# Patient Record
Sex: Female | Born: 1960 | Race: Black or African American | Hispanic: No | Marital: Single | Smoking: Never smoker
Health system: Southern US, Community
[De-identification: ages and names within clinical notes are randomized; demographics above are authoritative.]

## PROBLEM LIST (undated history)

## (undated) DIAGNOSIS — E10319 Type 1 diabetes mellitus with unspecified diabetic retinopathy without macular edema: Secondary | ICD-10-CM

## (undated) DIAGNOSIS — K219 Gastro-esophageal reflux disease without esophagitis: Secondary | ICD-10-CM

## (undated) DIAGNOSIS — D649 Anemia, unspecified: Secondary | ICD-10-CM

## (undated) DIAGNOSIS — E1039 Type 1 diabetes mellitus with other diabetic ophthalmic complication: Secondary | ICD-10-CM

## (undated) DIAGNOSIS — R739 Hyperglycemia, unspecified: Secondary | ICD-10-CM

## (undated) DIAGNOSIS — E7800 Pure hypercholesterolemia, unspecified: Secondary | ICD-10-CM

## (undated) DIAGNOSIS — R64 Cachexia: Secondary | ICD-10-CM

## (undated) DIAGNOSIS — N920 Excessive and frequent menstruation with regular cycle: Secondary | ICD-10-CM

## (undated) DIAGNOSIS — J309 Allergic rhinitis, unspecified: Secondary | ICD-10-CM

## (undated) DIAGNOSIS — L409 Psoriasis, unspecified: Secondary | ICD-10-CM

---

## 1998-02-06 ENCOUNTER — Emergency Department (HOSPITAL_COMMUNITY): Admission: EM | Admit: 1998-02-06 | Discharge: 1998-02-06 | Payer: Self-pay | Admitting: Emergency Medicine

## 1999-06-26 ENCOUNTER — Encounter: Admission: RE | Admit: 1999-06-26 | Discharge: 1999-06-26 | Payer: Self-pay | Admitting: Hematology and Oncology

## 1999-06-28 ENCOUNTER — Encounter: Payer: Self-pay | Admitting: Family Medicine

## 1999-06-28 ENCOUNTER — Ambulatory Visit (HOSPITAL_COMMUNITY): Admission: RE | Admit: 1999-06-28 | Discharge: 1999-06-28 | Payer: Self-pay | Admitting: Family Medicine

## 1999-06-29 ENCOUNTER — Encounter: Admission: RE | Admit: 1999-06-29 | Discharge: 1999-06-29 | Payer: Self-pay | Admitting: Internal Medicine

## 1999-07-01 ENCOUNTER — Emergency Department (HOSPITAL_COMMUNITY): Admission: EM | Admit: 1999-07-01 | Discharge: 1999-07-01 | Payer: Self-pay | Admitting: Emergency Medicine

## 1999-09-03 ENCOUNTER — Emergency Department (HOSPITAL_COMMUNITY): Admission: EM | Admit: 1999-09-03 | Discharge: 1999-09-03 | Payer: Self-pay | Admitting: Emergency Medicine

## 1999-10-05 ENCOUNTER — Encounter: Admission: RE | Admit: 1999-10-05 | Discharge: 1999-10-05 | Payer: Self-pay | Admitting: Internal Medicine

## 2000-01-16 ENCOUNTER — Encounter: Payer: Self-pay | Admitting: Internal Medicine

## 2000-01-16 ENCOUNTER — Inpatient Hospital Stay (HOSPITAL_COMMUNITY): Admission: EM | Admit: 2000-01-16 | Discharge: 2000-01-18 | Payer: Self-pay | Admitting: Internal Medicine

## 2000-02-01 ENCOUNTER — Inpatient Hospital Stay: Admission: EM | Admit: 2000-02-01 | Discharge: 2000-02-02 | Payer: Self-pay

## 2000-02-05 ENCOUNTER — Emergency Department (HOSPITAL_COMMUNITY): Admission: EM | Admit: 2000-02-05 | Discharge: 2000-02-05 | Payer: Self-pay | Admitting: Emergency Medicine

## 2000-02-21 ENCOUNTER — Other Ambulatory Visit: Admission: RE | Admit: 2000-02-21 | Discharge: 2000-02-21 | Payer: Self-pay | Admitting: Obstetrics and Gynecology

## 2000-04-22 ENCOUNTER — Inpatient Hospital Stay (HOSPITAL_COMMUNITY): Admission: EM | Admit: 2000-04-22 | Discharge: 2000-04-23 | Payer: Self-pay | Admitting: Emergency Medicine

## 2000-05-15 ENCOUNTER — Emergency Department (HOSPITAL_COMMUNITY): Admission: EM | Admit: 2000-05-15 | Discharge: 2000-05-15 | Payer: Self-pay | Admitting: Emergency Medicine

## 2000-05-19 ENCOUNTER — Encounter: Admission: RE | Admit: 2000-05-19 | Discharge: 2000-05-19 | Payer: Self-pay | Admitting: Internal Medicine

## 2000-06-02 ENCOUNTER — Emergency Department (HOSPITAL_COMMUNITY): Admission: EM | Admit: 2000-06-02 | Discharge: 2000-06-03 | Payer: Self-pay | Admitting: Emergency Medicine

## 2000-06-09 ENCOUNTER — Encounter: Admission: RE | Admit: 2000-06-09 | Discharge: 2000-06-09 | Payer: Self-pay | Admitting: Internal Medicine

## 2000-08-03 ENCOUNTER — Emergency Department (HOSPITAL_COMMUNITY): Admission: EM | Admit: 2000-08-03 | Discharge: 2000-08-03 | Payer: Self-pay | Admitting: Emergency Medicine

## 2000-08-03 ENCOUNTER — Encounter: Payer: Self-pay | Admitting: Emergency Medicine

## 2000-08-26 ENCOUNTER — Encounter: Admission: RE | Admit: 2000-08-26 | Discharge: 2000-08-26 | Payer: Self-pay | Admitting: Hematology and Oncology

## 2000-09-09 ENCOUNTER — Encounter: Admission: RE | Admit: 2000-09-09 | Discharge: 2000-09-09 | Payer: Self-pay | Admitting: Internal Medicine

## 2000-10-20 ENCOUNTER — Emergency Department (HOSPITAL_COMMUNITY): Admission: EM | Admit: 2000-10-20 | Discharge: 2000-10-21 | Payer: Self-pay | Admitting: Emergency Medicine

## 2000-12-05 ENCOUNTER — Inpatient Hospital Stay (HOSPITAL_COMMUNITY): Admission: EM | Admit: 2000-12-05 | Discharge: 2000-12-05 | Payer: Self-pay | Admitting: *Deleted

## 2001-02-17 ENCOUNTER — Encounter: Admission: RE | Admit: 2001-02-17 | Discharge: 2001-02-17 | Payer: Self-pay | Admitting: Internal Medicine

## 2001-05-24 ENCOUNTER — Emergency Department (HOSPITAL_COMMUNITY): Admission: EM | Admit: 2001-05-24 | Discharge: 2001-05-24 | Payer: Self-pay | Admitting: *Deleted

## 2001-06-12 ENCOUNTER — Emergency Department (HOSPITAL_COMMUNITY): Admission: EM | Admit: 2001-06-12 | Discharge: 2001-06-12 | Payer: Self-pay | Admitting: Emergency Medicine

## 2001-06-14 ENCOUNTER — Emergency Department (HOSPITAL_COMMUNITY): Admission: EM | Admit: 2001-06-14 | Discharge: 2001-06-14 | Payer: Self-pay | Admitting: Emergency Medicine

## 2001-06-17 ENCOUNTER — Encounter: Admission: RE | Admit: 2001-06-17 | Discharge: 2001-06-17 | Payer: Self-pay | Admitting: Internal Medicine

## 2001-07-03 ENCOUNTER — Encounter: Admission: RE | Admit: 2001-07-03 | Discharge: 2001-10-01 | Payer: Self-pay | Admitting: Internal Medicine

## 2001-07-14 ENCOUNTER — Encounter: Admission: RE | Admit: 2001-07-14 | Discharge: 2001-07-14 | Payer: Self-pay | Admitting: Internal Medicine

## 2001-10-23 ENCOUNTER — Emergency Department (HOSPITAL_COMMUNITY): Admission: EM | Admit: 2001-10-23 | Discharge: 2001-10-23 | Payer: Self-pay | Admitting: Emergency Medicine

## 2001-12-25 ENCOUNTER — Emergency Department (HOSPITAL_COMMUNITY): Admission: EM | Admit: 2001-12-25 | Discharge: 2001-12-25 | Payer: Self-pay | Admitting: Emergency Medicine

## 2002-01-09 ENCOUNTER — Emergency Department (HOSPITAL_COMMUNITY): Admission: EM | Admit: 2002-01-09 | Discharge: 2002-01-10 | Payer: Self-pay

## 2002-01-13 ENCOUNTER — Emergency Department (HOSPITAL_COMMUNITY): Admission: EM | Admit: 2002-01-13 | Discharge: 2002-01-13 | Payer: Self-pay | Admitting: Emergency Medicine

## 2002-03-21 ENCOUNTER — Emergency Department (HOSPITAL_COMMUNITY): Admission: EM | Admit: 2002-03-21 | Discharge: 2002-03-21 | Payer: Self-pay | Admitting: *Deleted

## 2002-07-19 ENCOUNTER — Emergency Department (HOSPITAL_COMMUNITY): Admission: EM | Admit: 2002-07-19 | Discharge: 2002-07-19 | Payer: Self-pay | Admitting: Emergency Medicine

## 2002-08-25 ENCOUNTER — Encounter: Payer: Self-pay | Admitting: Family Medicine

## 2002-08-25 ENCOUNTER — Encounter: Admission: RE | Admit: 2002-08-25 | Discharge: 2002-08-25 | Payer: Self-pay | Admitting: Family Medicine

## 2003-01-06 ENCOUNTER — Inpatient Hospital Stay (HOSPITAL_COMMUNITY): Admission: EM | Admit: 2003-01-06 | Discharge: 2003-01-08 | Payer: Self-pay | Admitting: Emergency Medicine

## 2003-06-28 ENCOUNTER — Emergency Department (HOSPITAL_COMMUNITY): Admission: EM | Admit: 2003-06-28 | Discharge: 2003-06-29 | Payer: Self-pay | Admitting: Emergency Medicine

## 2004-07-16 ENCOUNTER — Ambulatory Visit: Payer: Self-pay | Admitting: Family Medicine

## 2004-07-16 ENCOUNTER — Ambulatory Visit: Payer: Self-pay | Admitting: *Deleted

## 2004-09-02 ENCOUNTER — Emergency Department (HOSPITAL_COMMUNITY): Admission: EM | Admit: 2004-09-02 | Discharge: 2004-09-02 | Payer: Self-pay

## 2004-10-22 ENCOUNTER — Emergency Department (HOSPITAL_COMMUNITY): Admission: EM | Admit: 2004-10-22 | Discharge: 2004-10-22 | Payer: Self-pay | Admitting: Emergency Medicine

## 2004-12-17 ENCOUNTER — Emergency Department (HOSPITAL_COMMUNITY): Admission: EM | Admit: 2004-12-17 | Discharge: 2004-12-17 | Payer: Self-pay | Admitting: Emergency Medicine

## 2004-12-18 ENCOUNTER — Ambulatory Visit: Payer: Self-pay | Admitting: Family Medicine

## 2004-12-31 ENCOUNTER — Ambulatory Visit: Payer: Self-pay | Admitting: Family Medicine

## 2005-01-03 ENCOUNTER — Ambulatory Visit: Payer: Self-pay | Admitting: Family Medicine

## 2005-01-22 ENCOUNTER — Ambulatory Visit: Payer: Self-pay | Admitting: Family Medicine

## 2005-03-05 ENCOUNTER — Ambulatory Visit: Payer: Self-pay | Admitting: Family Medicine

## 2005-03-15 ENCOUNTER — Emergency Department (HOSPITAL_COMMUNITY): Admission: EM | Admit: 2005-03-15 | Discharge: 2005-03-15 | Payer: Self-pay | Admitting: Emergency Medicine

## 2005-03-21 ENCOUNTER — Ambulatory Visit: Payer: Self-pay | Admitting: Gastroenterology

## 2005-03-21 ENCOUNTER — Encounter (INDEPENDENT_AMBULATORY_CARE_PROVIDER_SITE_OTHER): Payer: Self-pay | Admitting: Specialist

## 2005-03-21 ENCOUNTER — Inpatient Hospital Stay (HOSPITAL_COMMUNITY): Admission: EM | Admit: 2005-03-21 | Discharge: 2005-03-22 | Payer: Self-pay | Admitting: Emergency Medicine

## 2005-05-28 ENCOUNTER — Ambulatory Visit: Payer: Self-pay | Admitting: Family Medicine

## 2005-06-14 ENCOUNTER — Ambulatory Visit: Payer: Self-pay | Admitting: Family Medicine

## 2005-09-02 ENCOUNTER — Emergency Department (HOSPITAL_COMMUNITY): Admission: EM | Admit: 2005-09-02 | Discharge: 2005-09-03 | Payer: Self-pay | Admitting: Emergency Medicine

## 2005-12-19 ENCOUNTER — Ambulatory Visit: Payer: Self-pay | Admitting: Family Medicine

## 2005-12-27 ENCOUNTER — Ambulatory Visit: Payer: Self-pay | Admitting: Family Medicine

## 2005-12-30 ENCOUNTER — Ambulatory Visit: Payer: Self-pay | Admitting: Family Medicine

## 2006-01-21 ENCOUNTER — Ambulatory Visit: Payer: Self-pay | Admitting: Family Medicine

## 2006-04-24 ENCOUNTER — Ambulatory Visit: Payer: Self-pay | Admitting: Family Medicine

## 2006-05-14 ENCOUNTER — Encounter: Admission: RE | Admit: 2006-05-14 | Discharge: 2006-08-12 | Payer: Self-pay | Admitting: Internal Medicine

## 2006-05-20 ENCOUNTER — Ambulatory Visit: Payer: Self-pay | Admitting: Internal Medicine

## 2006-06-04 ENCOUNTER — Inpatient Hospital Stay (HOSPITAL_COMMUNITY): Admission: EM | Admit: 2006-06-04 | Discharge: 2006-06-05 | Payer: Self-pay | Admitting: Emergency Medicine

## 2006-10-10 ENCOUNTER — Encounter: Admission: RE | Admit: 2006-10-10 | Discharge: 2007-01-08 | Payer: Self-pay | Admitting: Internal Medicine

## 2006-12-14 ENCOUNTER — Inpatient Hospital Stay (HOSPITAL_COMMUNITY): Admission: EM | Admit: 2006-12-14 | Discharge: 2006-12-15 | Payer: Self-pay | Admitting: Emergency Medicine

## 2007-01-22 ENCOUNTER — Encounter: Payer: Self-pay | Admitting: Internal Medicine

## 2007-02-13 DIAGNOSIS — L408 Other psoriasis: Secondary | ICD-10-CM

## 2007-02-13 DIAGNOSIS — D539 Nutritional anemia, unspecified: Secondary | ICD-10-CM | POA: Insufficient documentation

## 2007-02-13 DIAGNOSIS — Z91199 Patient's noncompliance with other medical treatment and regimen due to unspecified reason: Secondary | ICD-10-CM | POA: Insufficient documentation

## 2007-02-13 DIAGNOSIS — I10 Essential (primary) hypertension: Secondary | ICD-10-CM | POA: Insufficient documentation

## 2007-02-13 DIAGNOSIS — IMO0002 Reserved for concepts with insufficient information to code with codable children: Secondary | ICD-10-CM | POA: Insufficient documentation

## 2007-02-13 DIAGNOSIS — F17200 Nicotine dependence, unspecified, uncomplicated: Secondary | ICD-10-CM | POA: Insufficient documentation

## 2007-02-13 DIAGNOSIS — F341 Dysthymic disorder: Secondary | ICD-10-CM

## 2007-02-13 DIAGNOSIS — E1065 Type 1 diabetes mellitus with hyperglycemia: Secondary | ICD-10-CM

## 2007-02-13 DIAGNOSIS — F1010 Alcohol abuse, uncomplicated: Secondary | ICD-10-CM

## 2007-02-13 DIAGNOSIS — Z9119 Patient's noncompliance with other medical treatment and regimen: Secondary | ICD-10-CM

## 2007-05-01 ENCOUNTER — Emergency Department (HOSPITAL_COMMUNITY): Admission: EM | Admit: 2007-05-01 | Discharge: 2007-05-01 | Payer: Self-pay | Admitting: Emergency Medicine

## 2007-05-05 ENCOUNTER — Ambulatory Visit: Payer: Self-pay | Admitting: Family Medicine

## 2007-05-06 ENCOUNTER — Encounter (INDEPENDENT_AMBULATORY_CARE_PROVIDER_SITE_OTHER): Payer: Self-pay | Admitting: *Deleted

## 2007-07-06 ENCOUNTER — Inpatient Hospital Stay (HOSPITAL_COMMUNITY): Admission: EM | Admit: 2007-07-06 | Discharge: 2007-07-07 | Payer: Self-pay | Admitting: *Deleted

## 2007-08-04 ENCOUNTER — Ambulatory Visit: Payer: Self-pay | Admitting: Family Medicine

## 2007-08-04 LAB — CONVERTED CEMR LAB
Calcium: 9.3 mg/dL (ref 8.4–10.5)
Glucose, Bld: 116 mg/dL — ABNORMAL HIGH (ref 70–99)
Potassium: 3.7 meq/L (ref 3.5–5.3)
Sodium: 143 meq/L (ref 135–145)

## 2007-12-24 ENCOUNTER — Ambulatory Visit: Payer: Self-pay | Admitting: Internal Medicine

## 2008-03-08 ENCOUNTER — Ambulatory Visit: Payer: Self-pay | Admitting: Internal Medicine

## 2008-07-02 ENCOUNTER — Emergency Department (HOSPITAL_COMMUNITY): Admission: EM | Admit: 2008-07-02 | Discharge: 2008-07-03 | Payer: Self-pay | Admitting: Emergency Medicine

## 2008-08-15 ENCOUNTER — Ambulatory Visit: Payer: Self-pay | Admitting: Family Medicine

## 2008-08-23 ENCOUNTER — Ambulatory Visit (HOSPITAL_COMMUNITY): Admission: RE | Admit: 2008-08-23 | Discharge: 2008-08-23 | Payer: Self-pay | Admitting: Family Medicine

## 2008-10-25 ENCOUNTER — Ambulatory Visit: Payer: Self-pay | Admitting: Family Medicine

## 2008-10-25 LAB — CONVERTED CEMR LAB: Microalb, Ur: 3.35 mg/dL — ABNORMAL HIGH (ref 0.00–1.89)

## 2008-12-05 ENCOUNTER — Ambulatory Visit: Payer: Self-pay | Admitting: Family Medicine

## 2008-12-06 ENCOUNTER — Encounter (HOSPITAL_COMMUNITY): Admission: RE | Admit: 2008-12-06 | Discharge: 2009-03-06 | Payer: Self-pay | Admitting: Family Medicine

## 2008-12-23 ENCOUNTER — Ambulatory Visit: Payer: Self-pay | Admitting: Family Medicine

## 2008-12-27 ENCOUNTER — Ambulatory Visit: Payer: Self-pay | Admitting: Family Medicine

## 2009-01-03 ENCOUNTER — Ambulatory Visit: Payer: Self-pay | Admitting: Internal Medicine

## 2009-02-17 ENCOUNTER — Encounter (INDEPENDENT_AMBULATORY_CARE_PROVIDER_SITE_OTHER): Payer: Self-pay | Admitting: Family Medicine

## 2009-02-17 ENCOUNTER — Ambulatory Visit: Payer: Self-pay | Admitting: Family Medicine

## 2009-02-28 ENCOUNTER — Emergency Department (HOSPITAL_COMMUNITY): Admission: EM | Admit: 2009-02-28 | Discharge: 2009-02-28 | Payer: Self-pay | Admitting: Family Medicine

## 2009-09-04 ENCOUNTER — Emergency Department (HOSPITAL_COMMUNITY): Admission: EM | Admit: 2009-09-04 | Discharge: 2009-09-04 | Payer: Self-pay | Admitting: Emergency Medicine

## 2009-09-05 ENCOUNTER — Ambulatory Visit: Payer: Self-pay | Admitting: Gastroenterology

## 2009-09-05 ENCOUNTER — Inpatient Hospital Stay (HOSPITAL_COMMUNITY): Admission: EM | Admit: 2009-09-05 | Discharge: 2009-09-08 | Payer: Self-pay | Admitting: Emergency Medicine

## 2009-09-29 ENCOUNTER — Inpatient Hospital Stay (HOSPITAL_COMMUNITY): Admission: EM | Admit: 2009-09-29 | Discharge: 2009-09-30 | Payer: Self-pay | Admitting: Emergency Medicine

## 2010-03-31 ENCOUNTER — Emergency Department (HOSPITAL_COMMUNITY): Admission: EM | Admit: 2010-03-31 | Discharge: 2010-03-31 | Payer: Self-pay | Admitting: Emergency Medicine

## 2010-06-27 ENCOUNTER — Inpatient Hospital Stay (HOSPITAL_COMMUNITY): Admission: EM | Admit: 2010-06-27 | Discharge: 2010-06-29 | Payer: Self-pay | Admitting: Emergency Medicine

## 2010-09-09 ENCOUNTER — Encounter: Payer: Self-pay | Admitting: Urology

## 2010-09-18 NOTE — Procedures (Signed)
Summary: Colonoscopy / [facility]  Colonoscopy / [facility]   Imported By: Lennie Odor 01/31/2010 15:31:36  _____________________________________________________________________  External Attachment:    Type:   Image     Comment:   External Document

## 2010-09-18 NOTE — Procedures (Signed)
Summary: Upper Endoscopy  Patient: Nancy Singh Note: All result statuses are Final unless otherwise noted.  Tests: (1) Upper Endoscopy (EGD)   EGD Upper Endoscopy       DONE     [facility]     [address]     [place], [place]  [address]           ENDOSCOPY PROCEDURE REPORT           PATIENT:  [name], [name]  MR#:  [id number]     BIRTHDATE:  02-02-1961, 48 yrs. old  GENDER:  female           ENDOSCOPIST:  Barbette Hair. Arlyce Dice, MD     Referred by:           PROCEDURE DATE:  09/05/2009     PROCEDURE:  EGD, diagnostic     ASA CLASS:  Class II     INDICATIONS:  hematemesis           MEDICATIONS:   Fentanyl 100 mcg IV, Versed 6 mg IV, glycopyrrolate     (Robinal) 0.2 mg IV     TOPICAL ANESTHETIC:  Cetacaine Spray           DESCRIPTION OF PROCEDURE:   After the risks benefits and     alternatives of the procedure were thoroughly explained, informed     consent was obtained.  The EG-[id number] ([id number]) endoscope was     introduced through the mouth and advanced to the third portion of     the duodenum, without limitations.  The instrument was slowly     withdrawn as the mucosa was fully examined.     <<PROCEDUREIMAGES>>           Esophagitis was found in the mid esophagus. Severe, grade D     erosive esophagitis lower 2/3 (see image003 and image004).  A     stricture was found at the gastroesophageal junction (see     image002). Early esophageal stricture  Otherwise the examination     was normal.    Retroflexed views revealed no abnormalities.    The     scope was then withdrawn from the patient and the procedure     completed.           COMPLICATIONS:  None           ENDOSCOPIC IMPRESSION:     1) Esophagitis in the mid esophagus     2) Stricture at the gastroesophageal junction     3) Otherwise normal examination           Hematemasis secondary to esophagitis           RECOMMENDATIONS:     1) continue PPI           REPEAT EXAM:  No        ______________________________     Barbette Hair. Arlyce Dice, MD           CC:           n.     eSIGNED:   Barbette Hair. Abigayl Hor at 09/05/2009 02:42 PM           Rosalie Gums, [id number]  Note: An exclamation mark (!) indicates a result that was not dispersed into the flowsheet. Document Creation Date: 09/05/2009 2:43 PM _______________________________________________________________________  (1) Order result status: Final Collection or observation date-time: 09/05/2009 14:35 Requested date-time:  Receipt date-time:  Reported date-time:  Referring Physician:   Ordering  Physician: Melvia Heaps [phone]) Specimen Source:  Source: Launa Grill Order Number: [id number] Lab site:

## 2010-10-30 LAB — HEMOGLOBINOPATHY EVALUATION
Hemoglobin Other: 0 % (ref 0.0–0.0)
Hgb A2 Quant: 1.4 % — ABNORMAL LOW (ref 2.2–3.2)
Hgb F Quant: 0 % (ref 0.0–2.0)
Hgb S Quant: 0 % (ref 0.0–0.0)

## 2010-10-30 LAB — CROSSMATCH
DAT, IgG: NEGATIVE
Unit division: 0

## 2010-10-30 LAB — BLOOD GAS, ARTERIAL
Acid-base deficit: 1 mmol/L (ref 0.0–2.0)
Bicarbonate: 23.3 mEq/L (ref 20.0–24.0)
TCO2: 22.7 mmol/L (ref 0–100)
pCO2 arterial: 39.2 mmHg (ref 35.0–45.0)
pH, Arterial: 7.391 (ref 7.350–7.400)
pO2, Arterial: 83.6 mmHg (ref 80.0–100.0)

## 2010-10-30 LAB — RAPID URINE DRUG SCREEN, HOSP PERFORMED
Amphetamines: NOT DETECTED
Cocaine: NOT DETECTED
Opiates: NOT DETECTED
Tetrahydrocannabinol: NOT DETECTED

## 2010-10-30 LAB — URINALYSIS, ROUTINE W REFLEX MICROSCOPIC
Hgb urine dipstick: NEGATIVE
Nitrite: NEGATIVE
Protein, ur: NEGATIVE mg/dL
Urobilinogen, UA: 0.2 mg/dL (ref 0.0–1.0)

## 2010-10-30 LAB — BASIC METABOLIC PANEL
BUN: 5 mg/dL — ABNORMAL LOW (ref 6–23)
CO2: 21 mEq/L (ref 19–32)
CO2: 22 mEq/L (ref 19–32)
Calcium: 6.5 mg/dL — ABNORMAL LOW (ref 8.4–10.5)
Calcium: 6.7 mg/dL — ABNORMAL LOW (ref 8.4–10.5)
Creatinine, Ser: 0.95 mg/dL (ref 0.4–1.2)
GFR calc Af Amer: 59 mL/min — ABNORMAL LOW (ref >60)
GFR calc non Af Amer: 49 mL/min — ABNORMAL LOW (ref >60)
Glucose, Bld: 179 mg/dL — ABNORMAL HIGH (ref 70–99)
Potassium: 3.3 mEq/L — ABNORMAL LOW (ref 3.5–5.1)
Sodium: 137 mEq/L (ref 135–145)

## 2010-10-30 LAB — COMPREHENSIVE METABOLIC PANEL
ALT: 15 U/L (ref 0–35)
AST: 21 U/L (ref 0–37)
AST: 21 U/L (ref 0–37)
Alkaline Phosphatase: 80 U/L (ref 39–117)
BUN: 35 mg/dL — ABNORMAL HIGH (ref 6–23)
CO2: 19 mEq/L (ref 19–32)
CO2: 23 mEq/L (ref 19–32)
Calcium: 7.7 mg/dL — ABNORMAL LOW (ref 8.4–10.5)
Calcium: 7.9 mg/dL — ABNORMAL LOW (ref 8.4–10.5)
Chloride: 101 mEq/L (ref 96–112)
Chloride: 105 mEq/L (ref 96–112)
Creatinine, Ser: 2.02 mg/dL — ABNORMAL HIGH (ref 0.4–1.2)
GFR calc Af Amer: 24 mL/min — ABNORMAL LOW (ref >60)
GFR calc Af Amer: 32 mL/min — ABNORMAL LOW (ref >60)
GFR calc non Af Amer: 20 mL/min — ABNORMAL LOW (ref >60)
GFR calc non Af Amer: 26 mL/min — ABNORMAL LOW (ref >60)
Potassium: 3.6 mEq/L (ref 3.5–5.1)
Sodium: 139 mEq/L (ref 135–145)
Total Bilirubin: 0.9 mg/dL (ref 0.3–1.2)
Total Bilirubin: 1 mg/dL (ref 0.3–1.2)

## 2010-10-30 LAB — CBC
HCT: 29.6 % — ABNORMAL LOW (ref 36.0–46.0)
HCT: 30 % — ABNORMAL LOW (ref 36.0–46.0)
Hemoglobin: 8.1 g/dL — ABNORMAL LOW (ref 12.0–15.0)
Hemoglobin: 9.3 g/dL — ABNORMAL LOW (ref 12.0–15.0)
MCH: 19.7 pg — ABNORMAL LOW (ref 26.0–34.0)
MCH: 19.8 pg — ABNORMAL LOW (ref 26.0–34.0)
MCHC: 30 g/dL (ref 30.0–36.0)
MCHC: 30.9 g/dL (ref 30.0–36.0)
Platelets: 386 10*3/uL (ref 150–400)
Platelets: 593 10*3/uL — ABNORMAL HIGH (ref 150–400)
RBC: 4.68 MIL/uL (ref 3.87–5.11)
RBC: 4.71 MIL/uL (ref 3.87–5.11)
RBC: 4.83 MIL/uL (ref 3.87–5.11)
RDW: 22.8 % — ABNORMAL HIGH (ref 11.5–15.5)
RDW: 27.5 % — ABNORMAL HIGH (ref 11.5–15.5)
RDW: 28.3 % — ABNORMAL HIGH (ref 11.5–15.5)
WBC: 18.8 10*3/uL — ABNORMAL HIGH (ref 4.0–10.5)
WBC: 22.1 10*3/uL — ABNORMAL HIGH (ref 4.0–10.5)

## 2010-10-30 LAB — DIFFERENTIAL
Basophils Absolute: 0 10*3/uL (ref 0.0–0.1)
Basophils Relative: 0 % (ref 0–1)
Eosinophils Absolute: 0 10*3/uL (ref 0.0–0.7)
Eosinophils Relative: 0 % (ref 0–5)
Lymphocytes Relative: 1 % — ABNORMAL LOW (ref 12–46)
Lymphocytes Relative: 4 % — ABNORMAL LOW (ref 12–46)
Monocytes Absolute: 0.2 10*3/uL (ref 0.1–1.0)
Monocytes Absolute: 1.1 10*3/uL — ABNORMAL HIGH (ref 0.1–1.0)
Monocytes Relative: 1 % — ABNORMAL LOW (ref 3–12)
Neutrophils Relative %: 91 % — ABNORMAL HIGH (ref 43–77)
Neutrophils Relative %: 98 % — ABNORMAL HIGH (ref 43–77)

## 2010-10-30 LAB — GLUCOSE, CAPILLARY
Glucose-Capillary: 100 mg/dL — ABNORMAL HIGH (ref 70–99)
Glucose-Capillary: 115 mg/dL — ABNORMAL HIGH (ref 70–99)
Glucose-Capillary: 123 mg/dL — ABNORMAL HIGH (ref 70–99)
Glucose-Capillary: 144 mg/dL — ABNORMAL HIGH (ref 70–99)
Glucose-Capillary: 149 mg/dL — ABNORMAL HIGH (ref 70–99)
Glucose-Capillary: 200 mg/dL — ABNORMAL HIGH (ref 70–99)
Glucose-Capillary: 203 mg/dL — ABNORMAL HIGH (ref 70–99)
Glucose-Capillary: 228 mg/dL — ABNORMAL HIGH (ref 70–99)
Glucose-Capillary: 291 mg/dL — ABNORMAL HIGH (ref 70–99)
Glucose-Capillary: 319 mg/dL — ABNORMAL HIGH (ref 70–99)
Glucose-Capillary: 471 mg/dL — ABNORMAL HIGH (ref 70–99)
Glucose-Capillary: 567 mg/dL (ref 70–99)

## 2010-10-30 LAB — PROTIME-INR
INR: 1.18 (ref 0.00–1.49)
Prothrombin Time: 15.2 seconds (ref 11.6–15.2)

## 2010-10-30 LAB — MRSA PCR SCREENING: MRSA by PCR: NEGATIVE

## 2010-10-30 LAB — URINE MICROSCOPIC-ADD ON

## 2010-10-30 LAB — LIPASE, BLOOD: Lipase: 27 U/L (ref 11–59)

## 2010-10-30 LAB — PREGNANCY, URINE: Preg Test, Ur: NEGATIVE

## 2010-10-30 LAB — BILIRUBIN, DIRECT: Bilirubin, Direct: 0.1 mg/dL (ref 0.0–0.3)

## 2010-10-30 LAB — HEMOCCULT GUIAC POC 1CARD (OFFICE): Fecal Occult Bld: NEGATIVE

## 2010-11-04 LAB — COMPREHENSIVE METABOLIC PANEL
AST: 26 U/L (ref 0–37)
CO2: 23 mEq/L (ref 19–32)
Chloride: 93 mEq/L — ABNORMAL LOW (ref 96–112)
Creatinine, Ser: 1.19 mg/dL (ref 0.4–1.2)
GFR calc Af Amer: 59 mL/min — ABNORMAL LOW (ref >60)
GFR calc non Af Amer: 49 mL/min — ABNORMAL LOW (ref >60)
Total Bilirubin: 1.4 mg/dL — ABNORMAL HIGH (ref 0.3–1.2)

## 2010-11-04 LAB — URINALYSIS, ROUTINE W REFLEX MICROSCOPIC
Bilirubin Urine: NEGATIVE
Glucose, UA: >1000 mg/dL — AB
Hgb urine dipstick: NEGATIVE
Ketones, ur: 40 mg/dL — AB
Protein, ur: NEGATIVE mg/dL

## 2010-11-04 LAB — CBC
HCT: 28.6 % — ABNORMAL LOW (ref 36.0–46.0)
Hemoglobin: 9 g/dL — ABNORMAL LOW (ref 12.0–15.0)
MCV: 64.8 fL — ABNORMAL LOW (ref 78.0–100.0)
RBC: 4.42 MIL/uL (ref 3.87–5.11)
WBC: 13.7 10*3/uL — ABNORMAL HIGH (ref 4.0–10.5)

## 2010-11-04 LAB — DIFFERENTIAL
Basophils Absolute: 0 10*3/uL (ref 0.0–0.1)
Eosinophils Absolute: 0 10*3/uL (ref 0.0–0.7)
Lymphocytes Relative: 6 % — ABNORMAL LOW (ref 12–46)
Lymphs Abs: 0.8 10*3/uL (ref 0.7–4.0)
Neutro Abs: 12.2 10*3/uL — ABNORMAL HIGH (ref 1.7–7.7)

## 2010-11-04 LAB — GLUCOSE, CAPILLARY
Glucose-Capillary: 182 mg/dL — ABNORMAL HIGH (ref 70–99)
Glucose-Capillary: 361 mg/dL — ABNORMAL HIGH (ref 70–99)

## 2010-11-05 LAB — CARDIAC PANEL(CRET KIN+CKTOT+MB+TROPI)
CK, MB: 1.9 ng/mL (ref 0.3–4.0)
Relative Index: INVALID (ref 0.0–2.5)
Total CK: 98 U/L (ref 7–177)
Troponin I: 0.03 ng/mL (ref 0.00–0.06)

## 2010-11-05 LAB — CBC
HCT: 23.2 % — ABNORMAL LOW (ref 36.0–46.0)
HCT: 23.5 % — ABNORMAL LOW (ref 36.0–46.0)
HCT: 25.5 % — ABNORMAL LOW (ref 36.0–46.0)
HCT: 25.8 % — ABNORMAL LOW (ref 36.0–46.0)
HCT: 26.5 % — ABNORMAL LOW (ref 36.0–46.0)
HCT: 27.1 % — ABNORMAL LOW (ref 36.0–46.0)
Hemoglobin: 7 g/dL — ABNORMAL LOW (ref 12.0–15.0)
Hemoglobin: 7.4 g/dL — ABNORMAL LOW (ref 12.0–15.0)
Hemoglobin: 7.6 g/dL — ABNORMAL LOW (ref 12.0–15.0)
Hemoglobin: 7.9 g/dL — ABNORMAL LOW (ref 12.0–15.0)
Hemoglobin: 8 g/dL — ABNORMAL LOW (ref 12.0–15.0)
Hemoglobin: 9 g/dL — ABNORMAL LOW (ref 12.0–15.0)
MCHC: 30.8 g/dL (ref 30.0–36.0)
MCHC: 31.2 g/dL (ref 30.0–36.0)
MCHC: 31.4 g/dL (ref 30.0–36.0)
MCHC: 31.5 g/dL (ref 30.0–36.0)
MCHC: 31.7 g/dL (ref 30.0–36.0)
MCHC: 32 g/dL (ref 30.0–36.0)
MCHC: 32 g/dL (ref 30.0–36.0)
MCV: 65.1 fL — ABNORMAL LOW (ref 78.0–100.0)
MCV: 65.1 fL — ABNORMAL LOW (ref 78.0–100.0)
MCV: 65.3 fL — ABNORMAL LOW (ref 78.0–100.0)
MCV: 65.8 fL — ABNORMAL LOW (ref 78.0–100.0)
MCV: 65.9 fL — ABNORMAL LOW (ref 78.0–100.0)
Platelets: 312 10*3/uL (ref 150–400)
Platelets: 384 10*3/uL (ref 150–400)
Platelets: 408 10*3/uL — ABNORMAL HIGH (ref 150–400)
Platelets: 414 10*3/uL — ABNORMAL HIGH (ref 150–400)
RBC: 3.36 MIL/uL — ABNORMAL LOW (ref 3.87–5.11)
RBC: 3.61 MIL/uL — ABNORMAL LOW (ref 3.87–5.11)
RBC: 3.7 MIL/uL — ABNORMAL LOW (ref 3.87–5.11)
RBC: 3.91 MIL/uL (ref 3.87–5.11)
RBC: 4.21 MIL/uL (ref 3.87–5.11)
RDW: 19.3 % — ABNORMAL HIGH (ref 11.5–15.5)
RDW: 19.4 % — ABNORMAL HIGH (ref 11.5–15.5)
RDW: 19.6 % — ABNORMAL HIGH (ref 11.5–15.5)
RDW: 19.8 % — ABNORMAL HIGH (ref 11.5–15.5)
WBC: 15.3 10*3/uL — ABNORMAL HIGH (ref 4.0–10.5)
WBC: 15.3 10*3/uL — ABNORMAL HIGH (ref 4.0–10.5)
WBC: 6.8 10*3/uL (ref 4.0–10.5)
WBC: 7.3 10*3/uL (ref 4.0–10.5)
WBC: 8.3 10*3/uL (ref 4.0–10.5)
WBC: 8.6 10*3/uL (ref 4.0–10.5)

## 2010-11-05 LAB — GLUCOSE, CAPILLARY
Glucose-Capillary: 118 mg/dL — ABNORMAL HIGH (ref 70–99)
Glucose-Capillary: 122 mg/dL — ABNORMAL HIGH (ref 70–99)
Glucose-Capillary: 137 mg/dL — ABNORMAL HIGH (ref 70–99)
Glucose-Capillary: 141 mg/dL — ABNORMAL HIGH (ref 70–99)
Glucose-Capillary: 147 mg/dL — ABNORMAL HIGH (ref 70–99)
Glucose-Capillary: 176 mg/dL — ABNORMAL HIGH (ref 70–99)
Glucose-Capillary: 185 mg/dL — ABNORMAL HIGH (ref 70–99)
Glucose-Capillary: 216 mg/dL — ABNORMAL HIGH (ref 70–99)
Glucose-Capillary: 275 mg/dL — ABNORMAL HIGH (ref 70–99)
Glucose-Capillary: 296 mg/dL — ABNORMAL HIGH (ref 70–99)
Glucose-Capillary: 91 mg/dL (ref 70–99)

## 2010-11-05 LAB — IRON AND TIBC
Iron: 14 ug/dL — ABNORMAL LOW (ref 42–135)
Saturation Ratios: 4 % — ABNORMAL LOW (ref 20–55)
UIBC: 304 ug/dL
UIBC: 325 ug/dL

## 2010-11-05 LAB — COMPREHENSIVE METABOLIC PANEL
ALT: 10 U/L (ref 0–35)
AST: 20 U/L (ref 0–37)
Calcium: 8 mg/dL — ABNORMAL LOW (ref 8.4–10.5)
Creatinine, Ser: 1.13 mg/dL (ref 0.4–1.2)
GFR calc Af Amer: >60 mL/min (ref >60)
Sodium: 132 mEq/L — ABNORMAL LOW (ref 135–145)
Total Protein: 5.8 g/dL — ABNORMAL LOW (ref 6.0–8.3)

## 2010-11-05 LAB — BASIC METABOLIC PANEL
BUN: 10 mg/dL (ref 6–23)
CO2: 20 mEq/L (ref 19–32)
CO2: 23 mEq/L (ref 19–32)
Calcium: 7.9 mg/dL — ABNORMAL LOW (ref 8.4–10.5)
Chloride: 100 mEq/L (ref 96–112)
Creatinine, Ser: 0.96 mg/dL (ref 0.4–1.2)
Creatinine, Ser: 1.13 mg/dL (ref 0.4–1.2)
GFR calc Af Amer: >60 mL/min (ref >60)
GFR calc Af Amer: >60 mL/min (ref >60)
GFR calc Af Amer: >60 mL/min (ref >60)
GFR calc non Af Amer: 52 mL/min — ABNORMAL LOW (ref >60)
GFR calc non Af Amer: 60 mL/min — ABNORMAL LOW (ref >60)
Glucose, Bld: 295 mg/dL — ABNORMAL HIGH (ref 70–99)
Potassium: 3.3 mEq/L — ABNORMAL LOW (ref 3.5–5.1)
Potassium: 3.6 mEq/L (ref 3.5–5.1)
Potassium: 3.9 mEq/L (ref 3.5–5.1)
Sodium: 133 mEq/L — ABNORMAL LOW (ref 135–145)
Sodium: 137 mEq/L (ref 135–145)

## 2010-11-05 LAB — DIFFERENTIAL
Basophils Absolute: 0 10*3/uL (ref 0.0–0.1)
Basophils Absolute: 0 10*3/uL (ref 0.0–0.1)
Eosinophils Relative: 0 % (ref 0–5)
Eosinophils Relative: 0 % (ref 0–5)
Lymphocytes Relative: 7 % — ABNORMAL LOW (ref 12–46)
Lymphocytes Relative: 8 % — ABNORMAL LOW (ref 12–46)
Lymphs Abs: 1.2 10*3/uL (ref 0.7–4.0)
Monocytes Relative: 3 % (ref 3–12)
Monocytes Relative: 5 % (ref 3–12)
Neutro Abs: 14.2 10*3/uL — ABNORMAL HIGH (ref 1.7–7.7)
Neutrophils Relative %: 89 % — ABNORMAL HIGH (ref 43–77)

## 2010-11-05 LAB — PROTIME-INR: INR: 1.16 (ref 0.00–1.49)

## 2010-11-05 LAB — CROSSMATCH
ABO/RH(D): O POS
Antibody Screen: NEGATIVE

## 2010-11-05 LAB — FERRITIN: Ferritin: 5 ng/mL — ABNORMAL LOW (ref 10–291)

## 2010-11-05 LAB — TROPONIN I: Troponin I: 0.02 ng/mL (ref 0.00–0.06)

## 2010-11-05 LAB — CK TOTAL AND CKMB (NOT AT ARMC): Relative Index: 2 (ref 0.0–2.5)

## 2010-11-05 LAB — RETICULOCYTES
RBC.: 4.1 MIL/uL (ref 3.87–5.11)
Retic Count, Absolute: 68 10*3/uL (ref 19.0–186.0)
Retic Ct Pct: 2 % (ref 0.4–3.1)

## 2010-11-05 LAB — LIPID PANEL
Cholesterol: 228 mg/dL — ABNORMAL HIGH (ref 0–200)
LDL Cholesterol: 132 mg/dL — ABNORMAL HIGH (ref 0–99)

## 2010-11-05 LAB — VITAMIN B12: Vitamin B-12: 825 pg/mL (ref 211–911)

## 2010-11-05 LAB — FOLATE: Folate: 14.8 ng/mL

## 2010-11-09 LAB — COMPREHENSIVE METABOLIC PANEL
ALT: <8 U/L (ref 0–35)
AST: 12 U/L (ref 0–37)
Albumin: 2.6 g/dL — ABNORMAL LOW (ref 3.5–5.2)
CO2: 25 mEq/L (ref 19–32)
Chloride: 99 mEq/L (ref 96–112)
Creatinine, Ser: 0.93 mg/dL (ref 0.4–1.2)
GFR calc Af Amer: >60 mL/min (ref >60)
GFR calc non Af Amer: >60 mL/min (ref >60)
Potassium: 4 mEq/L (ref 3.5–5.1)
Sodium: 131 mEq/L — ABNORMAL LOW (ref 135–145)
Total Bilirubin: 0.6 mg/dL (ref 0.3–1.2)

## 2010-11-09 LAB — GLUCOSE, CAPILLARY: Glucose-Capillary: 51 mg/dL — ABNORMAL LOW (ref 70–99)

## 2010-11-09 LAB — DIFFERENTIAL
Basophils Absolute: 0 10*3/uL (ref 0.0–0.1)
Basophils Relative: 0 % (ref 0–1)
Basophils Relative: 0 % (ref 0–1)
Blasts: 0 %
Eosinophils Absolute: 0.6 10*3/uL (ref 0.0–0.7)
Lymphs Abs: 1.9 10*3/uL (ref 0.7–4.0)
Monocytes Absolute: 1.6 10*3/uL — ABNORMAL HIGH (ref 0.1–1.0)
Myelocytes: 0 %
Neutro Abs: 21 10*3/uL — ABNORMAL HIGH (ref 1.7–7.7)
Neutro Abs: 27 10*3/uL — ABNORMAL HIGH (ref 1.7–7.7)
Neutrophils Relative %: 87 % — ABNORMAL HIGH (ref 43–77)
Promyelocytes Absolute: 0 %

## 2010-11-09 LAB — URINALYSIS, ROUTINE W REFLEX MICROSCOPIC
Glucose, UA: >1000 mg/dL — AB
Hgb urine dipstick: NEGATIVE
Leukocytes, UA: NEGATIVE
Protein, ur: NEGATIVE mg/dL
Specific Gravity, Urine: 1.027 (ref 1.005–1.030)
Urobilinogen, UA: 0.2 mg/dL (ref 0.0–1.0)

## 2010-11-09 LAB — CBC
HCT: 15.1 % — ABNORMAL LOW (ref 36.0–46.0)
Hemoglobin: 4.9 g/dL — CL (ref 12.0–15.0)
Hemoglobin: 8.1 g/dL — ABNORMAL LOW (ref 12.0–15.0)
Hemoglobin: 8.1 g/dL — ABNORMAL LOW (ref 12.0–15.0)
MCHC: 31.9 g/dL (ref 30.0–36.0)
MCHC: 32.5 g/dL (ref 30.0–36.0)
MCV: 66.2 fL — ABNORMAL LOW (ref 78.0–100.0)
MCV: 66.8 fL — ABNORMAL LOW (ref 78.0–100.0)
Platelets: 427 10*3/uL — ABNORMAL HIGH (ref 150–400)
Platelets: 493 10*3/uL — ABNORMAL HIGH (ref 150–400)
RBC: 3.67 MIL/uL — ABNORMAL LOW (ref 3.87–5.11)
RBC: 3.74 MIL/uL — ABNORMAL LOW (ref 3.87–5.11)
RBC: 3.9 MIL/uL (ref 3.87–5.11)
RDW: 23 % — ABNORMAL HIGH (ref 11.5–15.5)
RDW: 23.8 % — ABNORMAL HIGH (ref 11.5–15.5)
WBC: 15.4 10*3/uL — ABNORMAL HIGH (ref 4.0–10.5)

## 2010-11-09 LAB — URINE MICROSCOPIC-ADD ON

## 2010-11-09 LAB — POCT I-STAT, CHEM 8
BUN: 24 mg/dL — ABNORMAL HIGH (ref 6–23)
Calcium, Ion: 1.09 mmol/L — ABNORMAL LOW (ref 1.12–1.32)
Chloride: 98 mEq/L (ref 96–112)
Glucose, Bld: 523 mg/dL — ABNORMAL HIGH (ref 70–99)
TCO2: 20 mmol/L (ref 0–100)

## 2010-11-09 LAB — TYPE AND SCREEN
ABO/RH(D): O POS
Antibody Screen: POSITIVE

## 2010-11-26 LAB — POCT I-STAT, CHEM 8
Calcium, Ion: 1.28 mmol/L (ref 1.12–1.32)
Creatinine, Ser: 1 mg/dL (ref 0.4–1.2)
Glucose, Bld: 87 mg/dL (ref 70–99)
HCT: 41 % (ref 36.0–46.0)
Hemoglobin: 13.9 g/dL (ref 12.0–15.0)
Potassium: 4.2 mEq/L (ref 3.5–5.1)

## 2010-11-28 LAB — GLUCOSE, CAPILLARY: Glucose-Capillary: 83 mg/dL (ref 70–99)

## 2010-11-28 LAB — CROSSMATCH

## 2010-11-28 LAB — ABO/RH: ABO/RH(D): O POS

## 2010-12-07 ENCOUNTER — Emergency Department (HOSPITAL_COMMUNITY)
Admission: EM | Admit: 2010-12-07 | Discharge: 2010-12-07 | Discharge status: Against Medical Advice | Payer: Self-pay | Attending: Emergency Medicine | Admitting: Emergency Medicine

## 2010-12-07 DIAGNOSIS — Z794 Long term (current) use of insulin: Secondary | ICD-10-CM | POA: Insufficient documentation

## 2010-12-07 DIAGNOSIS — E119 Type 2 diabetes mellitus without complications: Secondary | ICD-10-CM | POA: Insufficient documentation

## 2010-12-07 DIAGNOSIS — E785 Hyperlipidemia, unspecified: Secondary | ICD-10-CM | POA: Insufficient documentation

## 2010-12-07 DIAGNOSIS — I10 Essential (primary) hypertension: Secondary | ICD-10-CM | POA: Insufficient documentation

## 2010-12-07 DIAGNOSIS — K219 Gastro-esophageal reflux disease without esophagitis: Secondary | ICD-10-CM | POA: Insufficient documentation

## 2010-12-07 LAB — GLUCOSE, CAPILLARY: Glucose-Capillary: >600 mg/dL (ref 70–99)

## 2010-12-13 ENCOUNTER — Emergency Department (HOSPITAL_COMMUNITY)
Admission: EM | Admit: 2010-12-13 | Discharge: 2010-12-13 | Disposition: A | Discharge status: Home / Self Care | Payer: Self-pay | Attending: Emergency Medicine | Admitting: Emergency Medicine

## 2010-12-13 ENCOUNTER — Emergency Department (HOSPITAL_COMMUNITY): Payer: Self-pay

## 2010-12-13 DIAGNOSIS — I10 Essential (primary) hypertension: Secondary | ICD-10-CM | POA: Insufficient documentation

## 2010-12-13 DIAGNOSIS — Z794 Long term (current) use of insulin: Secondary | ICD-10-CM | POA: Insufficient documentation

## 2010-12-13 DIAGNOSIS — E119 Type 2 diabetes mellitus without complications: Secondary | ICD-10-CM | POA: Insufficient documentation

## 2010-12-13 DIAGNOSIS — R112 Nausea with vomiting, unspecified: Secondary | ICD-10-CM | POA: Insufficient documentation

## 2010-12-13 DIAGNOSIS — R197 Diarrhea, unspecified: Secondary | ICD-10-CM | POA: Insufficient documentation

## 2010-12-13 DIAGNOSIS — E785 Hyperlipidemia, unspecified: Secondary | ICD-10-CM | POA: Insufficient documentation

## 2010-12-13 DIAGNOSIS — K219 Gastro-esophageal reflux disease without esophagitis: Secondary | ICD-10-CM | POA: Insufficient documentation

## 2010-12-13 DIAGNOSIS — R1013 Epigastric pain: Secondary | ICD-10-CM | POA: Insufficient documentation

## 2010-12-13 LAB — COMPREHENSIVE METABOLIC PANEL
ALT: 23 U/L (ref 0–35)
AST: 33 U/L (ref 0–37)
Albumin: 4 g/dL (ref 3.5–5.2)
Calcium: 9.6 mg/dL (ref 8.4–10.5)
GFR calc Af Amer: 50 mL/min — ABNORMAL LOW (ref >60)
Glucose, Bld: 140 mg/dL — ABNORMAL HIGH (ref 70–99)
Sodium: 138 mEq/L (ref 135–145)
Total Protein: 8.3 g/dL (ref 6.0–8.3)

## 2010-12-13 LAB — DIFFERENTIAL
Basophils Absolute: 0 10*3/uL (ref 0.0–0.1)
Basophils Relative: 0 % (ref 0–1)
Eosinophils Absolute: 0.4 10*3/uL (ref 0.0–0.7)
Eosinophils Relative: 4 % (ref 0–5)
Monocytes Absolute: 0.7 10*3/uL (ref 0.1–1.0)

## 2010-12-13 LAB — LIPASE, BLOOD: Lipase: 17 U/L (ref 11–59)

## 2010-12-13 LAB — CBC
MCHC: 34 g/dL (ref 30.0–36.0)
Platelets: 322 10*3/uL (ref 150–400)
RDW: 18.5 % — ABNORMAL HIGH (ref 11.5–15.5)
WBC: 10.6 10*3/uL — ABNORMAL HIGH (ref 4.0–10.5)

## 2010-12-13 LAB — GLUCOSE, CAPILLARY: Glucose-Capillary: 112 mg/dL — ABNORMAL HIGH (ref 70–99)

## 2011-01-01 NOTE — H&P (Signed)
NAMEJESENYA, BOWDITCH NO.:  [id number]   MEDICAL RECORD NO.:  [id number]         PATIENT TYPE:  LINP   LOCATION:                               FACILITY:  [facility]   PHYSICIAN:  Marcellus Scott, MD     DATE OF BIRTH:  07-May-1961   DATE OF ADMISSION:  07/06/2007  DATE OF DISCHARGE:                              HISTORY & PHYSICAL   PRIMARY CARE PHYSICIAN:  Maurice March, M.D., [facility].   CHIEF COMPLAINT:  Muscle burning and generalized weakness, blurred  vision, dizziness, dyspnea.   HISTORY OF PRESENT ILLNESS:  Ms. Parisi is a pleasant 50 year old  African-American female patient with longstanding history of type 1  diabetes mellitus, chronic anemia, chronic hyponatremia and hypokalemia,  hypertension, hyperlipidemia.  She was in her usual state of health  until approximately 3 days ago.  Since then, the patient has progressive  generalized weakness and sense of her muscles burning.  She indicates  that, with activity, she is not able to do the amount she was able to do  prior to getting sick and has to stop with associated dyspnea on  exertion.  She has a nonproductive cough but no chest pain or  palpitations. The patient gives a history of feeling hot and cold and  claims that she has sensation of having hot flashes.  She denies any  fevers, chills, or rigors.  The patient has some nausea but no vomiting.  She claims her head feels stuffy but denies any lightheadedness or  vertigo.  There is no sore throat or earache.  The patient claims her  appetite is good, and she has not had any vomiting.  She has  constipation and uses laxatives but not over use.  She had a bowel  movement in the emergency room.  She denies any abdominal pain. She  claims she has had decreased urine output but no burning of the urine.  The patient for these symptoms drove herself to the emergency room.  Currently she is intermittently tearful.  She, however, indicates that  she  just feels like crying and is not sad.  On repeated questioning,  does not give reason for her being tearful.   PAST MEDICAL HISTORY:  1. Type 1 diabetes for 26 years.  2. 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  3. Hypokalemia.  4. Hyponatremia.  5. Hypomagnesemia.  6. Acute sinusitis-bronchiolitis.  7. Hypertension.  8. Gastroesophageal reflux disease.  9. Hyperlipidemia.   PAST SURGICAL HISTORY:  Laser eye surgery bilaterally.   ALLERGIES:  No known drug allergies.   MEDICATIONS:  1. Lipitor 10 mg p.o. daily.  2. Protonix 40 mg p.o. daily.  3. NovoLog 5 units subcutaneously b.i.d.  4. Lantus 15 units subcutaneously daily.  5. Lisinopril 10 mg daily.  6. Allegra D 60 mg p.o. daily.  7. Nasonex.   FAMILY HISTORY:  1. Mother died at age 43 from breast cancer.  2. The patient's father died at age 18 years from an MI.  He had      coronary artery disease status post CABG.  3. The patient's older sister also  has history of diabetes.   SOCIAL HISTORY:  The patient is single.  She has a 48 year old daughter.  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  The patient quit smoking one month ago.  She  consumes alcohol socially.  There is no history of drug abuse.   REVIEW OF SYSTEMS:  Fourteen systems reviewed and apart from History of  Present Illness is noncontributory.  The patient gives history of  irregular menstrual bleeding.  Last menstrual period was one month ago  which was heavy.   PHYSICAL EXAMINATION:  GENERAL:  Ms. Hefferan is a moderately built  female patient in no obvious cardiopulmonary or painful distress.  VITAL SIGNS:  Temperature 97.6 degrees F, blood pressure 89/64 mmHg,  pulse 96, respirations 16, O2 saturation 98% on room air.  HEAD, EYES, ENT:  Atraumatic and normocephalic.  Pupils equal, round,  and reactive to light and accommodation. Oral cavity with no  oropharyngeal erythema or  thrush.  NECK:  Without JVD, carotid bruit, or goiter.  There are 1 or 2  submandibular, nontender, discrete approximately 1 cm lymph nodes on  either side.  Neck is supple.  LYMPHATICS:  No other lymphadenopathy.  RESPIRATORY SYSTEM:  Clear to auscultation bilaterally.  CARDIOVASCULAR:  First and second heart sounds heard.  No third or  fourth heart sounds.  No murmurs, rubs, gallops, or clicks.  ABDOMEN:  Nondistended and nontender.  No organomegaly or mass  appreciated.  Bowel sounds present.  NEUROLOGIC:  The patient is awake, alert, oriented x3.  There is no  cranial nerve or focal neurological deficits.  EXTREMITIES:  No cyanosis, clubbing, or edema.  Peripheral pulses are  symmetric.  SKIN:  Healed macular rashes with scars over the extremities.  MUSCULOSKELETAL:  Noncontributory.   LABORATORY DATA:  Basic metabolic panel, which is being repeated now  with sodium 131, potassium 2.9, chloride 81, bicarb 41, glucose 232, BUN  49, creatinine 2.85, calcium 9.1.  The first basic metabolic panel had  sodium 122, potassium 2.3, chloride 73, bicarb 38, glucose 249, BUN 51,  creatinine  2.96.  Hepatic panel remarkable for albumin of 3.4.  Urine  microscopy showed no clinical evidence of UTI but has 30 mg/dl of  protein.  CBC with hemoglobin 10.4, hematocrit 30.3, white blood cells  4.9, platelets 239, MCV 89, 37.4.   Chest x-ray has been preliminary reported and reviewed by me, and there  is no acute abnormality.   EKG with normal sinus rhythm with no acute changes, QT is prolonged at  542.   ASSESSMENT AND PLAN:  1. Electrolyte abnormalities:  Hyponatremia, hypokalemia, worsening      creatinine, metabolic alkalosis.  Etiology probably secondary to      dehydration.  The patient denies use of laxatives, diuretics, or      vomiting or diarrhea.  Will admit to telemetry.  Will check random      cortisol, fasting lipids, uric acid, and urine electrolytes. Will      continue to  hydrate with IV normal saline and replete potassium and      monitor basic metabolic panel and potassium closely.  2. Acute on chronic kidney disease secondary to dehydration.  Will      hydrate with IV fluids and monitor with strict inputs and outputs,      and recheck basic metabolic panel.  Will hold lisinopril      temporarily.  3. Uncontrolled type  2 diabetes with nephropathy.  Continue home      Lantus,  mealtime NovoLog, and sliding scale insulin.  Will check      hemoglobin A1c.  4. Normocytic anemia.  Follow up CBCs.  5. Hypotension secondary to dehydration, intravascular volume      depletion, and ACE inhibitors.  Will check random cortisol.  Will      hydrate with IV normal saline and hold ACE inhibitors at this time.  6. Dyslipidemia.  Lipitor and check fasting lipids.  7. Prolonged QT: rpt EKG after electrolyte abnormality corrected and      followup magnesium.      Marcellus Scott, MD  Electronically Signed     AH/MEDQ  D:  07/06/2007  T:  07/06/2007  Job:  [id number]   cc:   Maurice March, M.D.  Fax: [phone]

## 2011-01-01 NOTE — Discharge Summary (Signed)
Nancy Singh, MEALING NO.:  [id number]   MEDICAL RECORD NO.:  [id number]          PATIENT TYPE:  INP   LOCATION:  1411                         FACILITY:  [facility]   PHYSICIAN:  Michaelyn Barter, M.D. DATE OF BIRTH:  1960-10-07   DATE OF ADMISSION:  07/06/2007  DATE OF DISCHARGE:  07/07/2007                               DISCHARGE SUMMARY   PRIMARY CARE DOCTOR:  Unassigned.   FINAL DIAGNOSES:  1. Hyponatremia.  2. Severe hypokalemia.  3. Renal insufficiency.  4. Metabolic alkalosis.  5. Weakness.   PROCEDURE:  Two-view chest x-ray completed July 06, 2007.   HISTORY OF PRESENT ILLNESS:  [name] is a 50 year old female with  history of type 1 diabetes mellitus, chronic anemia, hyponatremia and  hypokalemia.  She indicated that three days leading up to this  admission, she began to feel weak and experienced the sensation of  burning in her muscles.  She indicated that she occasionally felt hot  and cold.  Likewise, she also stated that she felt that as if her head  was stuffy, but denied having any lightheadedness.   For past medical history please see that dictated by Marcellus Scott, MD.   HOSPITAL COURSE:  PROBLEM #1 -  HYPONATREMIA:  The patient's sodium  level was noted to have been 131 at time of admission.  She was started  on a normal saline and by the following date her sodium level improved  to 134.   PROBLEM #2 -  SEVERE HYPOKALEMIA:  The patient was admitted with a  potassium level of 2.9.  For this, K-Dur was provided.  By July 07, 2007, the patient's potassium level had improved to 3.4.   PROBLEM #3 -  RENAL INSUFFICIENCY:  The patient's BUN at the time of her  admission was noted to be 51 with creatinine of 2.96.  The acuity of  this was questionable.  There may have been a component of dehydration  which contributed to this.  With IV fluid hydration, the patient's BUN  and creatinine did improve by the following date.   PROBLEM  #4 -  METABOLIC ALKALOSIS:  This may not have been related to  the patient's renal insufficiency.  The patient's bicarb on the day of  admission was 38.  By the following date, her bicarb was down to 36.   PROBLEM #5 -  WEAKNESS:  The patient indicated that she felt  significantly better, and actually by July 07, 2007, the patient had  contacted the nurses several times indicating that she wanted to be  discharged home.  She indicated that if a physician did not come to  discharge her right away, she would leave AMA.  The decision was made  upon the request of the patient to send her home.   On the date of discharge the patient's sodium was 134, potassium 3.4,  chloride 92, CO2 36, BUN 41, creatinine 2.13, glucose 68.  Calcium 8.1.  Cholesterol 223. TSH 0.968.  White blood cell count 5.2, hemoglobin 9.9,  hematocrit 28.8, platelets 250.   She was told to call her primary  care doctor within one week for follow-  up evaluation.      Michaelyn Barter, M.D.  Electronically Signed     OR/MEDQ  D:  07/26/2007  T:  07/27/2007  Job:  [id number]

## 2011-01-04 NOTE — H&P (Signed)
[facility]  Patient:    Nancy Singh, Nancy Singh                    MRN: [id number] Adm. Date:  [id number] Attending:  Rogelia Boga                         History and Physical  CHIEF COMPLAINT:  Nausea and vomiting.  HISTORY OF PRESENT ILLNESS:  The patient is a 50 year old black female with a history of insulin dependent diabetes mellitus of 18 years duration. She was last admitted to the hospital on Jan 16, 2000 for dehydration and intractable nausea and vomiting. The patient states that the evening prior to admission, she was drinking heavily and began having epigastric discomfort and uncontrollable nausea and vomiting. She presented to the emergency room where ethanol level was 214, potassium 2.0, magnesium level 1.1. The patient is now admitted for further evaluation and treatment for intractable nausea and vomiting and to correct her electrolyte abnormalities.  PAST MEDICAL HISTORY:  As mentioned, she was hospitalized here on Jan 16, 2000 for dehydration, electrolyte abnormalities and intractable nausea and vomiting. At that time, she was noted to have a moderately severe anemia and was treated with 2 units of packed red blood cells. Abdominal ultrasound was negative. She has a long history of insulin dependent diabetes mellitus since age 20.  CURRENT MEDICATIONS:  Insulin 70/30 15 units in the morning, 10 units in the afternoon, supplemental iron, supplemental potassium.  PAST MEDICAL HISTORY:  The patient has been hospitalized for a pregnancy and has a 77 year old daughter. The patient has also been hospitalized for diabetic complications. No surgeries.  SOCIAL HISTORY:  The patient is single with 54 year old daughter. The patient stopped smoking approximately 1 month ago. She states that she drinks heavily at least 2 times per week. She is presently employed with [employer].  REVIEW OF SYSTEMS:  Otherwise fairly  noncontributory. The patient has had no recent diabetic follow-up nor eye examinations.  PHYSICAL EXAMINATION:  VITAL SIGNS:  Blood pressure 110/70, pulse 90, temperature afebrile.  GENERAL:  Revealed a well-developed, alert, black female who was cooperative, pleasant and appeared to be in no acute distress.  SKIN: Unremarkable with rash.  HEENT:  Revealed normal pupil responses, conjunctivae clear. Funduscopic examination revealed no obvious retinopathy. Tympanic membranes, nares, oropharynx clear.  NECK:  Supple. There was no adenopathy or bruits. No thyroid enlargement.  CHEST:  Clear.  CARDIOVASCULAR:  Revealed normal S1, S2, rate approximately 90, no murmurs or gallops appreciated.  ABDOMEN:  Revealed some mild epigastric tenderness. There was no guarding. Bowel sounds were present. No organomegaly noted.  RECTAL:  Revealed Hematest negative stool.  EXTREMITIES:  Full peripheral pulses, no edema.  NEUROLOGIC:  Negative.  IMPRESSION: 1. Intractable nausea and vomiting with electrolyte abnormalities probably    secondary to alcohol related gastritis, doubt diabetic gastroparesis. 2. Alcoholism. 3. Anemia. This has been chronic and is probably related to iron deficiency    anemia presently stable. 4. Insulin dependent diabetes mellitus.  DISPOSITION:  Will admit to the hospital and correct her electrolyte abnormalities and fluid status. Blood sugars will be monitored and covered with a sliding scale. Her chronic alcoholism will be addressed. The patient will also have a hemoglobin and electrophoresis to better define her anemia. DD:  02/01/00 TD:  02/01/00 Job: [id number]

## 2011-01-04 NOTE — H&P (Signed)
[facility]  Patient:    Nancy Singh, Nancy Singh                      MRN: [id number] Adm. Date:  01/16/00 Attending:  Sonda Primes, M.D. [facility] CC:         Moshe Salisbury. Audria Nine, M.D.                         History and Physical  DATE OF BIRTH:  1961-07-25  CHIEF COMPLAINT:  Abdominal pain, dizziness, cough, nausea, vomiting.  HISTORY OF PRESENT ILLNESS:  The patient is a 50 year old black female with the above symptoms getting progressively worse over the past 3-4 months. Nausea and weakness are new as well as diarrhea. She had three loose stools today yellow in color, heme negative, Dr. Garen Lah exam. Her potassium was found to be low as well as hemoglobin.  MEDICINES:  Insulin 70/30 15 units in the morning, 10 units at HS. Iron tablets over the counter, potassium pills.  PAST MEDICAL HISTORY:  Diabetes mellitus type 1 since [age] years old. Anemia over several years duration, hypokalemia.  FAMILY HISTORY:  Mother died with breast cancer at the age of 48, father is living.  SOCIAL HISTORY:  She is living. She has a daughter 51 years old. She will start working for [employer]. She quit smoking 2 weeks ago.  REVIEW OF SYSTEMS:  She is not pregnant. She has been sick with the above symptoms for three months, otherwise negative.  PHYSICAL EXAMINATION:  VITAL SIGNS:  Heart rate 119, respirations 16, temperature 98.7, blood pressure 135/80. CBG 299.  GENERAL:  She is in no acute distress.  Looks tired. She is very pale.  HEENT:  Dry oral mucosa.  NECK:  Supple, no thyromegaly or bruit.  LUNGS:  Clear to auscultation and percussion. No rales or wheezes.  HEART:  With S1, S2, tachycardic. Grade 1-2/6 systolic murmur.  ABDOMEN:  Soft, diffusely sensitive, nontender, no masses felt. No scars.  EXTREMITIES:  Lower extremities without edema. No meningeal signs.  SKIN:  With psoriasis changes over the elbows. Cranial nerves II-XII  normal. Deep tendon reflexes, muscle strength within normal limits. She is alert, oriented, and cooperative.  LABORATORY DATA:  Potassium 2.4, sodium 132, chloride 85, CO2 37, glucose 371, creatinine 0.9, BUN 9, amylase 78, wbc 8.0, hemoglobin 7.6, platelet 277. Urinalysis normal. Urine pregnancy test negative. Abdominal ultrasound negative.  Previous labs on September 03, 1999, her hemoglobin was 8.4, potassium 3.0.  On July 01, 1999 hemoglobin was 6.8.  ASSESSMENT/PLAN: 1. Nausea, vomiting and diarrhea of unclear etiology. Possible diabetic g    gastroparesis versus infectious etiology. Will treat symptomatically.    Rehydrate. 2. Severe anemia, chronic/refractory, transfuse with 2 units of red blood    cells, start on iron with vitamin C. Obtain lab work including B12 level. 3. Dehydration. Will treat with IV fluids. 4. Hypokalemia. Chronic of unclear etiology. Obtain cortisone level, will    replace potassium. Check magnesium level. 5. Diabetes mellitus type 1. Sliding scale Humalog with 70/30 insulin to    continue. 6. Bronchitis. Will treat with IV antibiotics. Obtain chest x-ray. DD:  01/16/00 TD:  01/16/00 Job: [id number]

## 2011-01-04 NOTE — Discharge Summary (Signed)
[facility]  Patient:    Nancy Singh, Nancy Singh                    MRN: [id number] Adm. Date:  [id number] Disc. Date: [id number] Attending:  Shelba Flake Dictator:   Johnella Moloney, NP                           Discharge Summary  HISTORY OF PRESENT ILLNESS:  [name] is a 50 year old African-American single female admitted on involuntary basis December 04, 2000.  Chief complaint: "Im here because the emergency room people said I was trying to kill myself." Patient denies that she was trying to hurt herself.   Patient apparently states she went to [facility] Emergency Department after drinking 1 pint of rum.  She was celebrating her birthday with some friends.  She states she suddenly did not feel well.  She checked her glucose and it was low.  Patient says she did not have an empty bottle of glucose tablets in her hand.  She said her blood sugar was not going up and she was scared.  She reports that she went to her dads store and knocked on the door and asked him to call 911 because she realized her blood sugar was low.  She states she had no suicidal thoughts, that she did not attempt suicide.  She said she would never kill herself due to God and her family.  She denies depression.  Sleep varies.  She does work 3rd shift.  Appetite good.  No weight loss.  No hallucinations, no paranoia.  She denies what the commitment papers state.  She states that she was not suicidal.  She became hypoglycemic.  Patient was seen and evaluated by Dr. Claudette Head and she denied feeling depressed and denied any suicidal ideation. Mood and affect were bright, sleeping and eating fairly well.  Blood sugar was still elevated.  It was felt like we could discharge her.  She did not meet hospital criteria nor commitment criteria.  She is to follow up with her medical doctor and the mental health center.  The patient denies any previous inpatient treatment, no previous  suicide attempts.  Has been on Zoloft in the past but had an allergic reaction. Outpatient treatment with Dr. Feliciana Rossetti about 1 year ago.  She only saw him for 2 months.  PAST MEDICAL HISTORY:  Patients primary care doctor is Dr. Riki Altes at [facility] outpatient.  Medical problems:  Diabetes mellitus type 1, anemia, hypertension, and hypokalemia.  ADMISSION MEDICATIONS: 1. Insulin 70/30 15 units sub-q q.a.m. and 15 units sub-q q.a.c. supper. 2. Hydrochlorothiazide 25 mg p.o. qam 3. K-Dur 20 mEq t.i.d.  ALLERGIES:  ZOLOFT made her short of breath in the past.  PHYSICAL EXAMINATION:  Please see physical examination done at [facility] ED December 04, 2000, where she was hypoglycemic, had acute alcohol intoxication. Blood alcohol level was 230 and it went down to 140.  Urinalysis within normal limits, urine drug screen negative, pregnancy test negative, lipase 16.  CBC with diff normal with exception of her hematocrit which was low 33.1, hemoglobin low at 10.9, and RDW high at 15.2.  On arrival of [facility] Emergency Department CBG was 42, CMET within normal limits with exception of glucose which was elevated at 194, and potassium was decreased at 2.5.  MENTAL STATUS EXAMINATION: An attractive African-American female casually dressed and concentration.  Speech normal tone  and relevant.  Mood slightly angry about being here on a commitment.  Affect appropriate to situation.  No suicidal ideation or intent, no homicidal ideation or intent.  Thought processes were logical and coherent without evidence of psychosis.  No delusions, no hallucinations.  Cognitive, she is alert and oriented, cognitive function intact.  Judgment fair, insight good, impulse control fair.  ADMISSION DIAGNOSIS: Axis I:     1. Adjustment disorder with mixed emotional features.             2. Acute alcohol intoxication on the night prior to admission. Axis II:    Deferred. Axis III:   Diabetes mellitus type  1, anemia, hypertension, hypokalemia. Axis IV:    Mild, related to medical problems. Axis V:     Global assessment of function on admission 50, highest in past             year 85.  HOSPITAL COURSE:  Patient was admitted to [facility] on an involuntary commitment.  She was seen and evaluated the first day she was here.  It was felt that this patient was not depressed and was no suicidal and had no intent to hurt herself.  She was sleeping and eating well.  She certainly had problems with hypoglycemia and acute alcohol intoxication the night of her birthday, however it was not felt that she needed inpatient treatment for this.  It was felt that she could be managed outside a hospital setting.  CONDITION ON DISCHARGE:  Patient discharged in improved condition, with improvement in her mood, sleep, appetite, and no suicidal ideation or intent, no homicidal ideation or intent.  Energy good.  Basically, this patient had medical problems.  FOLLOW UP:  Patient is to follow up with [facility] outpatient clinic on Monday, April 29 at 3:15 with Riki Altes.  DISCHARGE MEDICATIONS: 1. Humulin 70/30 15 units b.i.d. 2. HCTZ 25 mg 1 tablet daily. 3. K-Dur 20 mEq 1 tab t.i.d.  FINAL DIAGNOSES: Axis I:     1. Adjustment disorder with mixed emotional features.             2. Acute alcohol intoxication on the night prior to admission. Axis II:    Deferred. Axis III:   Diabetes mellitus type 1, anemia, hypertension, hypokalemia. Axis IV:    Mild, related to medical problems. Axis V:     Current global assessment of function at discharge 60, highest             past year 85. DD:  12/15/00 TD:  12/15/00 Job: [id number]

## 2011-01-04 NOTE — H&P (Signed)
[facility]  Patient:    Nancy Singh, Nancy Singh                    MRN: [id number] Adm. Date:  [id number] Disc. Date: [id number] Attending:  Feliciana Rossetti                         History and Physical  CHIEF COMPLAINT:  Vomiting.  HISTORY OF PRESENT ILLNESS:  This 50 year old black female began vomiting the day prior to admission.  She was unable to keep in any food and subsequently developed diarrhea.  Her vomitus was nonbloody and nonbilious.  Her diarrhea was nonbloody and nonmelanic.  The patient did endorse chills and fevers.  She stated that when standing she felt as though she would lose consciousness. For this, she presented to the [facility] Emergency Room where she was seen by the emergency room doctor on call and was noted to have a potassium of 2.5.  For this reason and given the patients insulin-dependent diabetic state, I was called to admit the patient.  Upon questioning, the patient denied any change in her medicines.  She denied any recent travel. She denied focal numbness, paresthesias, or weakness.  She did describe profound weakness throughout her body.  She did endorse near loss of vision when standing.  She denied any double vision.  PAST MEDICAL HISTORY:  1. Insulin-dependent diabetes mellitus diagnosed at age 34.  2. Psoriasis.  PAST SURGICAL HISTORY:  None.  FAMILY HISTORY:  Hypertension and breast cancer.  SOCIAL HISTORY:  The patient smokes.  Minimal alcohol.  REVIEW OF SYSTEMS:  No skin breakdown.  No rash.  No change in bladder function.  MEDICATIONS: 1. Insulin 70/30 (10 units b.i.d.). 2. Regular Insulin given t.i.d. p.r.n.  (The patient checks her sugars t.i.d.)  ALLERGIES:  ZOLOFT (the patient sees blue spots).  PHYSICAL EXAMINATION:  Nontoxic and in no acute distress.  VITAL SIGNS:  Heart rate 110, respiratory rate 10, temperature 98 degrees, blood pressure 100/70.  HEENT:  Pupils equal, round, and  reactive to light.  The funduscopic exam reveals no exudate or hemorrhage.  Normal disk edges.  The mucous membranes are tacky.  The oropharynx is without lesions.  NECK:  Supple.  Lymph node survey negative.  No carotid bruits.  HEART:  Regular rhythm.  Rate of 110.  No murmurs, rubs, or gallops.  LUNGS:  Clear to auscultation.  Good symmetric.  ABDOMEN:  Soft.  No hepatosplenomegaly.  No rebound.  No guarding.  Normal bowel sounds.  EXTREMITIES:  No clubbing, cyanosis, or edema.  RECTAL:  Normal tone.  No blood in vault.  PELVIC:  Exam deferred to the outpatient setting.  BREASTS:  Exam deferred to the outpatient setting.  NEUROLOGIC:  Cranial nerves II-XII and muscle strength are within normal limits.  SKIN:  Without breakdown.  There is rash over the extensor surfaces of bilateral upper extremities.  LABORATORY DATA:  Potassium 2.5, sodium 134, glucose 331.  The arterial blood gas reveals a pH of 7.56.  ASSESSMENT AND PLAN: 1. Insulin-dependent diabetes mellitus.  Obtain diabetic teaching.  Consider    insulin sensitizer and Glucophage.  Use sliding scale insulin. 2. Hyponatremia.  Replete. 3. Hypokalemia.  Replete. 4. Alkalemia.  Expect will normalize with conservative measures. DD:  04/23/00 TD:  04/23/00 Job: [id number]

## 2011-01-04 NOTE — H&P (Signed)
Nancy Singh, Nancy Singh             ACCOUNT NO.:  [id number]   MEDICAL RECORD NO.:  [id number]          PATIENT TYPE:  INP   LOCATION:  0101                         FACILITY:  [facility]   PHYSICIAN:  Della Goo, M.D. DATE OF BIRTH:  [birth year]   DATE OF ADMISSION:  12/14/2006  DATE OF DISCHARGE:                              HISTORY & PHYSICAL   PRIMARY CARE PHYSICIAN:  [facility]   CHIEF COMPLAINT:  Dizziness, weakness.   HISTORY OF PRESENT ILLNESS:  This is a 50 year old female with type 1  diabetes mellitus, who presented to the emergency department secondary  to complaints of increased dizziness, weakness and feeling sick for 3  days.  She also reports having nasal and chest congestion and cough  productive of greenish phlegm with fevers and chills.  She reports  during this time period her blood sugars have been elevated and she has  had difficulty controlling them.  She does report taking her medication  and administering coverage with sliding-scale insulin.   PAST MEDICAL HISTORY:  1. Anemia.  2. Menorrhagia.  3. Type 1 diabetes mellitus.  4. Gastroesophageal reflux disease.  5. Hypertension.  6. Hyperlipidemia.  7. Hypokalemia.   PAST SURGICAL HISTORY:  None.   MEDICATIONS:  1. Twelve units of 70/30 insulin b.i.d.  2. Sliding-scale insulin with Novolin R.  3. Lipitor.  4. Lisinopril.  5. Protonix.  6. Iron therapy.   ALLERGIES:  NO KNOWN DRUG ALLERGIES.   SOCIAL HISTORY:  Nonsmoker, nondrinker.  The patient works at [employer].   FAMILY HISTORY:  Not obtained at this time.   REVIEW OF SYSTEMS:  Pertinents are mentioned above.  Also, the patient  reports having blood sugars in the 400s over the past few days.   PHYSICAL EXAMINATION:  GENERAL:  This is a 50 year old thin, well-  nourished, well-developed female in discomfort, but no acute distress.  VITAL SIGNS: Temperature 98.6, blood pressure 119/81, heart rate 96-110  and respirations  18.  O2 saturation is 99% to 100%.  HEENT:  Normocephalic, atraumatic.  Pupils are equally round and  reactive to light.  There is no scleral icterus.  Funduscopic benign.  Oropharynx is clear.  NECK:  Supple.  Full range of motion.  No thyromegaly, adenopathy or  jugulovenous distention.  CARDIOVASCULAR:  Regular rate and rhythm with mild tachycardia.  LUNGS:  Clear to auscultation bilaterally.  ABDOMEN:  Positive bowel sounds.  Soft, nontender and nondistended.  EXTREMITIES:  Without edema.  NEUROLOGIC:  Examination nonfocal.   LABORATORY STUDIES:  White blood cell count 6.2, hemoglobin 7.8,  hematocrit 25.5, platelets 247,000, MCV 62.8.  Sodium 128, potassium  3.5, chloride 81, CO2 37, BUN 26, creatinine 1.7 and glucose 601  initially.   RADIOLOGIC FINDINGS:  Chest x-ray findings reveal no acute disease  process.   ASSESSMENT:  [age]-year-old type 1 diabetic being admitted with:  1. Hyperglycemia.  2. Iron deficiency anemia.  3. Hypokalemia.  4. Hyponatremia.  5. Acute sinusitis with bronchitis/upper respiratory infection.   PLAN:  The patient has been administered IV insulin, 12 units of NovoLog  x1; this actually brought  her sugar down to the 120s.  The patient will  continue on sliding-scale insulin coverage of her insulin right now and  IV fluids.  Her electrolytes will be replaced p.r.n.  The patient has  been placed on a clear liquid diet.  IV antibiotic therapy has also been  ordered for her upper respiratory infection.  The patient has been  placed on DVT and GI prophylaxis.      Della Goo, M.D.  Electronically Signed     HJ/MEDQ  D:  12/14/2006  T:  12/15/2006  Job:  [id number]

## 2011-01-04 NOTE — Discharge Summary (Signed)
Nancy Singh, Nancy Singh NO.:  [id number]   MEDICAL RECORD NO.:  [id number]          PATIENT TYPE:  INP   LOCATION:  1417                         FACILITY:  [facility]   PHYSICIAN:  Isidor Holts, M.D.  DATE OF BIRTH:  1960/10/06   DATE OF ADMISSION:  03/21/2005  DATE OF DISCHARGE:  03/22/2005                                 DISCHARGE SUMMARY   PRIMARY CARE PHYSICIAN:  Dow Adolph, M.D.  The patient is unassigned  to [place].   FINAL DIAGNOSES:  1.  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  2.  Type 1 diabetes mellitus complicated by hypoglycemic episodes.  3.  History of gastroesophageal reflux disease.  4.  History of dyslipidemia.  5.  History of hypertension.  6.  Psoriasis.  7.  Anemia.  8.  Left facial pain, etiology unclear.   DISCHARGE MEDICATIONS:  The patient left AMA, therefore this is not  applicable.   PROCEDURE:  Upper GI endoscopy dated March 21, 3005.  This showed reflux  esophagitis, also gastric ulcer chronic without hemorrhage.  Small bowel  biopsy taken.   CONSULTATIONS:  Dr. Garrison Columbus. Christella Hartigan, gastroenterologist, about GI.   ADMISSION HISTORY:  As in H&P notes of March 21, 2005.  However, in brief,  this is a 50 year old female with medical history significant for type 1  diabetes mellitus, who has had previous admissions in the past, due to  abdominal pain, nausea and vomiting thought to be secondary to uncontrolled  diabetes and alcohol use.  She now presents with abdominal pain, nausea,  vomiting and a couple of episodes of query coffee ground emesis against a  background of NSAID use in the past week, because of left-sided facial pain.  According to the patient, she had pain recently seen by her PMD who felt she  may have a dental infection and had treated her with amoxicillin without  significant improvement.  She is known to have a history of  chronic anemia  associated with heavy menstrual periods.  There was no history of melena.  The patient was admitted for further evaluation, investigation and  management.   Problem 1.  Abdominal pain and vomiting as well as possible coffee ground  emesis, against a background of recent NSAID use.  Review of the patient's  labs revealed a hemoglobin of 7.9, MCV of 57.5, i.e. microcytic anemia,  which was felt to be acute on chronic.  The patient was managed with  transfusion of PRBCs resulting in a post transfusion hemoglobin of 10.7.  She was kept initially NPO, subsequently started on intravenous fluids and  GI consultation was called.  This was kindly provided by Dr. Rob Bunting,  [facility], who performed an upper GI endoscopy on March 21, 2005.  This  revealed esophagitis as well as a small gastric ulcer, which is nonbleeding  and possibly chronic.  The conclusion was that this was likely to be NSAID-  induced.  It was recommended that patient continue proton pump inhibitors  and avoid use of NSAIDs.  In view  of the fact that the patient had a chronic  microcytic anemia, small bowel biopsy was also done, essentially to rule out  the possibility of celiac disease.  It was felt that if that result came  back normal, the patient would then benefit from a colonoscopy.   Problem 2.  Type 1 diabetes mellitus.  At the time of presentation in the  emergency department, the patient was found to have elevated blood glucose  levels at 328 mg/dl.  She was commenced on sliding scale insulin coverage as  well as scheduled insulin.  According to nursing staff, on March 21, 2005 at  about 1350 p.m., the patient was found unresponsive and diaphoretic.  CBG at  that time read 0 mg/dl.  She was resuscitated with intravenous 50% dextrose,  resulting in complete recovery.  The patient's scheduled insulin was  therefore discontinued, sliding scale insulin was discontinued.  Regular  Accu-checks were  done and careful monitoring of patient was carried out.  At  the time of MD ward round at 8 a.m. of March 22, 2005, CBG levels were as  follows 345, 70, 44, and 87.  At this juncture, it was felt appropriate to  completely discontinue the patient's sliding scale insulin until CBG became  consistently over 200, then reintroduce sliding scale insulin coverage on an  insulin sensitive regimen.  The plan was to restart pre-admission insulin  preparation in a day or two, if clinically stable, but in a low dose and  then adjust thereafter.  Representative of the diabetic team expressed  concerns, that because the patient is insulin dependent, i.e. type 1, that  she may quite easily go into diabetic ketoacidosis without insulin.  However, given recent events, i.e. profound hypoglycemia from the patient  receiving relatively small doses of insulin in a short space of time against  a background of documented hyperglycemia, one was clearly uncomfortable with  the idea of commencing regular coverage until we were absolutely certain  that the patient was going to maintain glycemia at reasonable levels.  Compromise measure was to follow CBGs on an hourly basis until glucose  levels became above 200 and then we planned to reinstate sliding scale  insulin as mentioned above, in insulin sensitive regimen with a view to  commencing scheduled insulin on March 23, 2005.   Problem 3. Hypertension.  The patient remained normotensive during the  course of her hospital stay.   Problem 4. Left facial pain.  Detailed history revealed that this left-sided  facial pain was of a somewhat dull character, fluctuating in intensity, but  not precipitated or exacerbated by chewing or swallowing.  Examination of  the patient's mouth showed some caries in the posterior molars but no overt  gingivitis.  Given her background history of diabetes mellitus, it was  possible that this may be neuropathic pain.  Certainly, it did  not fit in with the typical clinical picture of trigeminal neuralgia.  It was felt that  a trial of Neurontin would be appropriate and this was commenced at a dose  of 100 mg p.o. t.i.d. initially.   Problem 5. Diarrhea.  At the time of evaluation on March 22, 2005 during  ward rounds, the patient stated that she had no further episodes of vomiting  or abdominal pain but she had experienced one episode of diarrhea.  Against  a background of recent amoxicillin treatment by patient's PMD, it was felt  that it would be appropriate to test stool samples for  C. difficile toxin.  This was ordered accordingly.   DISPOSITION:  At the time of ward rounds, in the a.m. of March 22, 2005, the  patient had expressed deep concern about the circumstances surrounding  profound hypoglycemic episode and wanted the matter to be looked into.  Therefore, it was discussed in great detail with her and in the end it was  felt that since she was particularly concerned with the occurrence and she  wanted to be sure that it was properly investigated, she might benefit from  speaking to the charge nurse and if possible, a patient advocate.  An order  was therefore placed in the chart for this to be carried out accordingly.  At about 300 p.m. on the same day, I was informed by nursing staff that the  patient wanted to leave AMA.  It appears that she wanted to smoke, and  obviously, this is not permitted, as [facility] is now a  smoke free campus and this would be strictly against hospital policy.  I had  a fairly long discussion with her about this, offered her Nicoderm  transdermal patch to blunt the craving for smoking.  The patient in fact  declined and even talked about leaving the hospital to smoke and then  subsequently presenting for re-admission.  I had deep reservations about  allowing this patient to leave the hospital for any reason whatsoever, given  the brittleness of her diabetes  mellitus and the fact that she had recently  suffered hypoglycemic episodes.  I felt that she was quite clinically  unstable from this viewpoint and would merit close observation.  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  The patient certainly is  mentally competent, completely oriented and appears to be very certain of  what she wants to do.  Even though I explained to her the danger of further  hypoglycemic episodes or hyperglycemia, which may even result in DKA, and  that she would benefit from at least another 24 hours of observation and  treatment, strongly recommended against leaving, she insisted on leaving  AMA.  I understand that her last CBG was above 300.  The patient has her own  supply of insulin at home and is able to monitor her blood glucose and  administer insulin on her own.  She was therefore not given any  prescriptions at the time of leaving the hospital.  Further disposition is not applicable as the patient left against medical  advice.      CO/MEDQ  D:  03/23/2005  T:  03/24/2005  Job:  [id number]   cc:   Maurice March, M.D.  [address] [place]  [place] [address]  Fax: [phone]

## 2011-01-04 NOTE — Discharge Summary (Signed)
[facility]  Patient:    Nancy Singh, Nancy Singh                    MRN: [id number] Adm. Date:  [id number] Disc. Date: [id number] Attending:  Donnetta Hutching                           Discharge Summary  DISCHARGE DIAGNOSES: 1. Hypokalemia. 2. Insulin-dependent diabetes mellitus, type 1. 3. Gastroenteritis.  REASON FOR HOSPITALIZATION:  Hypokalemia.  HOSPITAL COURSE:  The patient was admitted to a regular bed, treated with a diabetic diet and sliding scale insulin.  Diabetic teaching was requested. Potassium was repleted.  Librium was used for agitation.  Avandia 4 mg p.o. b.i.d. was added to patients regimen.  The patient was initially hypertensive at time of admission.  Her glucose was also significantly elevated.  The patient continued to improved clinically, and her initial blood pressure elevation normalized.  Her sugars normalized.  Laboratory values of note show that the patient had a hemoglobin of 10.6 on April 22, 2000, and this decreased to 10.0 on April 23, 2000. Laboratory measures of infection were within normal limits.  The patient initially was alkalotic on blood gas evaluation.  Initial potassium on April 22, 2000, was 2.5, and this is the primary reason the emergency room doctor called me for patient admission.  As well, glucose was elevated, and the fear was for further decrease in potassium and the potential for lethal arrhythmia.  The patient continued to do well objectively and subjectively.  CONDITION ON DISCHARGE:  Well-appearing, ambulating with ease, without shortness of breath.  Her sugars were well controlled.  Her vital signs showed blood pressure 130/70, pulse 88, temperature 98, and respirations 12.  DISCHARGE MEDICATIONS: 1. Insulin 70/30 at 10 a.m., 10 h.s. 2. Avandia 4 mg p.o. b.i.d. 3. Ultravate ointment b.i.d. to elbows.  FOLLOWUP:  With Dr. Quintella Reichert two weeks following discharge. DD:  05/28/00 TD:  05/30/00 Job:  [id number]

## 2011-01-04 NOTE — Discharge Summary (Signed)
[name], [name] NO.:  [id number]   MEDICAL RECORD NO.:  [id number]          PATIENT TYPE:  INP   LOCATION:  1439                         FACILITY:  [facility]   PHYSICIAN:  Nancy Singh, MDDATE OF BIRTH:  [birth year]   DATE OF ADMISSION:  12/14/2006  DATE OF DISCHARGE:  12/15/2006                               DISCHARGE SUMMARY   DISCHARGE SUMMARY:   SUMMARY OF HOSPITAL COURSE:  Nancy Singh is a 50 year old woman who was  admitted yesterday with severe hyperglycemia and acute sinusitis and  bronchiolitis.  The patient is a 50 year old type 1 diabetic who  presented to the hospital feeling poorly.  Please see H&P dictated  yesterday by Dr. Lovell Singh for full details.  In short, she was found to  be hyperglycemic and had an acute sinus infection.  She was admitted to  the hospital and treated for both of these.  Luckily, she was not in  diabetic ketoacidosis despite the fact that she is a type 1 diabetic.  The patient had been taking her sugars regularly, had not missed any of  her insulin and was worried by the fact that her blood sugars continued  to go up despite the fact that she was checking her accu checks, and she  was feeling poorly.  I suspect that some of this is from the acute  infection.  The patient also tells me that her insulin was quite cloudy  and she is not sure it was completely active which also may have  contributed, but the patient was put on fluids.  Her insulin was changed  from 70/30 to Lantus and within 24 hours, she really felt a lot better.  She did have hypokalemia and hypomagnesemia which was replaced.  She  also had a significant anemia on admission which she tells me she has  had in the past and has required transfusion for this.  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  The  patient reports that iron supplementation makes her quite sick, so it is  hard for her continue that regularly.   All in all, the patient was  feeling substantially better by the time of discharge.   DISCHARGE DIAGNOSES:  1. Type 1 diabetes uncontrolled with severe hyperglycemia.  This is      still a work in progress as her insulin has been adjusted and she      will need to continue to monitor her sugars and further adjustments      will likely be necessary which means her sugars may be suboptimal      for the next several days.  2. Post hemorrhagic anemia secondary to menometrorrhagia, status post      2 units of packed red blood cells.  3. Hypokalemia.  4. Hyponatremia.  5. Hypomagnesemia, all of which were treated.  6. Acute sinusitis and bronchiolitis.  7. Mild hypertension.   MEDICATIONS ON DISCHARGE:  1. Include lisinopril 10 mg 1/2 tablet p.o. daily.  2. Lipitor every evening, her dose is unknown, she will start Lantus  15 units subcu daily,  3. NovoLog sliding scale insulin, sliding scale was written out for      her.  4. Levaquin 500 mg p.o. daily for 7 days for acute sinusitis.  5. Sudafed 60 mg p.o. q.5h. as needed for nasal stuffiness.  (Her      hypertension is mild and her blood pressure did not go up      significantly with the Sudafed here in the hospital.  6. She is to discontinue her 70/30 insulin and her regular insulin in      that she took the sliding scale.   Since the patient is leaving the hospital quickly, we are asking that  she followup with primary care physician within the next 48 hours.  She  will be recording her sugars and will take that in with her to the  doctor's office.      Nancy Bottoms, MD  Electronically Signed     CVC/MEDQ  D:  12/15/2006  T:  12/16/2006  Job:  [id number]   cc:   Nancy Singh, M.D.  Fax: [phone]

## 2011-01-04 NOTE — Discharge Summary (Signed)
[facility]  Patient:    Nancy Singh, Nancy Singh                    MRN: [id number] Adm. Date:  [id number] Disc. Date: [id number] Attending:  Tresa Garter CC:         Valetta Mole. Swords, M.D. [facility]                           Discharge Summary  DISCHARGE DIAGNOSES: 1. Nausea, vomiting and dehydration, resolved. 2. Anemia unknown cause. 3. Hypokalemia, resolved. 4. Type 1 diabetes.  DISCHARGE MEDICATIONS: 1. Insulin 70/30 15 units q.a.m. and 10 units q.p.m. 2. Iron sulfate 325 p.o. b.i.d.  DISCHARGE LABORATORY DATA:  Hemoglobin 10./1, sodium 135, potassium 4.6, chloride 104, CO2 29, BUN 5, creatinine 0.7, hemoglobin A1C 7.6%. Iron level 152, TIBC 341%, saturations 45%.  HOSPITAL PROCEDURES:  Chest x-ray demonstrated no active disease, ultrasound of the abdomen was unremarkable.  CONDITION ON DISCHARGE:  Improved. She is at her baseline.  FOLLOW-UP:  Dr. Cato Mulligan in 2 weeks.  HOSPITAL COURSE:  The patient is admitted to the hospital service on Jan 16, 2000 by Dr. Posey Rea. See his note for details. The patient was found to be dehydrated. She was treated aggressively with IV hydration. During her hospitalization, her IV fluids were discontinued. She was able to tolerate a diet and maintain hydration.  On admission, there was some question that she may have some bronchitis. She was initially placed on Tequin. The day after admission, there were no signs and symptoms of infection. Tequin was discontinued.  Type 1 diabetes. Her hemoglobin A1C is 7.6%. She will be discharged on a regular dose of insulin. She had been followed at [facility]. No she has medical insurance. We will find an endocrinologist as an outpatient.  Fluid, electrolytes and nutrition. Found to be hypokalemic on admission. I suspect this is secondary to nausea and vomiting. Potassium was replaced in the hospital. Potassium level was normal at the time of discharge.  Anemia.  Apparently she has had chronic anemia. Evaluation to date is unclear. Her iron studies were not helpful in a diagnosis in the hospital. This will need to be followed as an outpatient and a diagnosis obtained given her young age. DD:  01/18/00 TD:  01/21/00 Job: [id number]

## 2011-01-04 NOTE — Discharge Summary (Signed)
NAME:  Nancy Singh, Nancy Singh                       ACCOUNT NO.:  [id number]   MEDICAL RECORD NO.:  [id number]                   PATIENT TYPE:  INP   LOCATION:  0347                                 FACILITY:  [facility]   PHYSICIAN:  Betti D. Pecola Leisure, M.D.                DATE OF BIRTH:  March 25, 1961   DATE OF ADMISSION:  01/06/2003  DATE OF DISCHARGE:  01/08/2003                                 DISCHARGE SUMMARY   CHIEF COMPLAINT:  Nausea and vomiting.   HISTORY OF PRESENT ILLNESS:  This is a 50 year old African-American female  with history of type 2 diabetes who presented with two to three day history  of complaint of gradual onset of vomiting and weakness. There is  documentation that the patient had vomited blood. During my interview with  this patient, she appeared to be very somnolent and unable to give her  history. Her blood sugar on arrival to the ER was greater than 500 which is  the highest number that will register on a mobile Glucometer. Her blood  pressure was 121/85 with a pulse rate of 111. Her temperature was 97.5. She  was saturating at 100% on room air, with a respiration of 17.   PERTINENT LABORATORY DATA:  The patient had a white count of 11.1. She had a  hemoglobin of 10.8 with a hematocrit of 33.5. Her sodium was 132 with a  potassium of 3.9. Her BUN was 9 with a creatinine of 1.6. Her blood sugar  was 482. Her blood gas revealed a pH of 7.245, a pCO2 of 15.9, pO2 of 128.2,  saturating at 97% on room air. The patient was admitted for what appeared to  be DKA. She was placed on IV fluids as well as insulin protocol for  treatment of her DKA. In addition, since she had history of alcohol abuse,  we did check a urine drug screen. Also, she has a history of depressive  disorder and alcoholic gastritis and was started on Protonix at 40 mg daily.  The patient's serum ketones were obtained on her visit, and she was  maintained on insulin until her ketones became negative. She was  restarted  on her home insulin which was 70/30 at 15 units twice a day on hospital day  #2. She was started on a clear liquid diet which she tolerated well. Over  the next 48 hours, the patient's blood sugar did normalize to a normal range  without any difficulty. The patient was more aroused and communicative.   DISPOSITION:  The patient was discharged home on 2,000 calorie ADA diet. She  was discharged home on Protonix 40 mg q.d. She was to resume her home  insulin 70/30 at 15 units in the morning and 10 units at night, and she was  to follow with Dr. Pecola Leisure within the next five to seven days.  Betti D. Pecola Leisure, M.D.    BDR/MEDQ  D:  01/26/2003  T:  01/27/2003  Job:  [id number]

## 2011-01-04 NOTE — H&P (Signed)
NAMECRISTIANA, YOCHIM NO.:  [id number]   MEDICAL RECORD NO.:  [id number]          PATIENT TYPE:  EMS   LOCATION:  ED                           FACILITY:  [facility]   PHYSICIAN:  Elliot Cousin, M.D.    DATE OF BIRTH:  [birth year]   DATE OF ADMISSION:  03/21/2005  DATE OF DISCHARGE:                                HISTORY & PHYSICAL   CHIEF COMPLAINT:  Abdominal pain, nausea and vomiting.   HISTORY OF PRESENT ILLNESS:  The patient is a 50 year old lady with past  medical history significant for type 1 diabetes mellitus, admissions in the  past secondary to abdominal pain, nausea and vomiting thought to be  secondary to uncontrolled diabetes and alcohol use, who presents to the  emergency department this morning with a chief complaint of abdominal pain,  nausea and vomiting.  The patient states that she had one or two episodes of  a dark-colored, possibly coffee-grounds emesis.  The abdominal pain, nausea  and vomiting started yesterday morning.  She admitted drinking one glass of  Rum yesterday.  She also has been taking ibuprofen over-the-counter three to  four tablets daily over the past 3 weeks secondary to left facial pain.  The  abdominal pain is primarily in the epigastric region without radiation to  the back or groin.  She does state that the abdominal pain does radiate to  the lower abdomen.  She has been unable to keep any fluids or solids down  over the past 24-48 hours.  Her last bowel movement was 2 days ago and was  considered normal.  She denies any recent history of diarrhea or  constipation.  She denies any known food contamination.  No sick contacts  with no recent history of painful urination.  Her last menstrual period was  1 week ago.  She usually has moderately heavy menstrual periods lasting 5  days.  She does have a history of chronic anemia.  Her pain is currently  rated at a 7/10 in intensity described as achy and crampy.  There has been  some  subjective fever and chills over the past 24 hours.  No shortness of  breath or chest pain noted.  She does have a chronic cough which is dry.  No  recent upper respiratory infection symptoms.  No recent history of melena or  hematochezia.  No known history of peptic ulcer disease.   When the patient was evaluated in the emergency department, she was  hemodynamically stable and afebrile.  Her lab data was significant for a  hemoglobin of 7.9, MCV 57.5 and potassium of 3.2.  Her lipase was within  normal limits at 16 and her alcohol level was less than 5.  The patient will  be admitted for further evaluation and management.   PAST MEDICAL HISTORY:  1.  Multiple admissions in the past secondary to abdominal pain, nausea and      vomiting.  The clinical presentations were felt to be secondary to      either uncontrolled diabetes mellitus and/or alcohol use.  2.  Type 1 diabetes mellitus diagnosed  at 50 years old.  3.  Hyperlipidemia.  4.  Remote history of hypertension.  5.  Gastroesophageal reflux disease.  6.  Psoriasis.  7.  Tobacco abuse.  8.  Alcohol use per her history, she know drinks alcohol occasionally.   MEDICATIONS:  1.  70/30 insulin via a pen 10-15 units in the morning and 10-15 units in      the evening.  2.  Lipitor 10 mg daily.  3.  Protonix 40 mg daily.   ALLERGIES:  No known drug allergies.   SOCIAL HISTORY:  The patient is single.  She has one daughter who is 72  years of age.  She lives in [place] with her father.  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  She smokes one pack of  cigarettes per day and has been doing so for approximately 20 years.  She  denies illicit drug use.  She drinks alcohol once every 2-3 weeks.  She can  read and write.  She completed the 12th grade.   FAMILY HISTORY:  The patient's father is living.  He is 55 years of age and  has a history of hypertension and open heart surgery.  Her mother died of  breast  cancer at 36 years old.  Her sister is 73 years of age and has  diabetes mellitus.   REVIEW OF SYSTEMS:  RESPIRATORY:  Dry, hacking cough.  DERMATOLOGIC:  Scaly  skin on her legs.  GASTROINTESTINAL:  Occasional nausea and abdominal pain.  HEENT:  Recent left facial pain, but none today.   PHYSICAL EXAMINATION:  VITAL SIGNS:  Temperature 98.0, blood pressure  195/98, repeated at 126/72, pulse 101, respirations 16, oxygen saturations  100% on room air.  GENERAL:  The patient is a pleasant, alert, 50 year old, African-American  woman who is currently lying in bed.  She appears to be ill and in mild to  moderate abdominal discomfort.  HEENT:  Head is normocephalic, nontraumatic.  Pupils equal round and  reactive to light.  Extraocular movements intact.  Conjunctivae are clear.  Sclerae are white.  Tympanic membranes are clear bilaterally.  Nasal mucosa  is moist, no sinus drainage.  Mucous membranes are mildly moist.  No  posterior exudates or erythema.  Teeth are in fairly good repair.  NECK:  Supple, no adenopathy, no thyromegaly, no bruit or JVD.  LUNGS:  Clear to auscultation bilaterally.  HEART:  S1, S2 with mild tachycardia.  ABDOMEN:  Hypoactive bowel sounds, soft, mildly to moderately tender in the  epigastric region and mildly tender in the hypogastrium, no distention, no  rebound, no guarding.  No masses palpated.  No hepatosplenomegaly.  GENITALIA:  Deferred.  RECTAL:  Per the emergency department physician, Dr. Estell Harpin, the stool was  brown and guaiac negative.  EXTREMITIES:  The patient has scaly plaques on the anterior surface of her  legs bilaterally.  No focal erythema, drainage or tenderness.  Pedal pulses  are 2+ bilaterally.  The patient has good range of motion of all of her  joints.  No acute joint abnormality.  NEUROLOGIC:  The patient is alert and oriented x3.  Cranial nerves 2-12 grossly intact.  Sensation is intact throughout.  Strength is 5/5.   LABORATORY DATA  AND X-RAY FINDINGS:  Urinalysis essentially negative.  Lipase 16.  Alcohol level less than 5.  Sodium 134, potassium 3.2, chloride  91, CO2 30, glucose 328, BUN 9, creatinine 0.9, calcium 7.8.  Total protein  6.7, albumin 3.4, AST 18, ALT 9, Alk phos 72, total bilirubin 0.5.  Urine  pregnancy test negative.  WBC 6.4, hemoglobin 7.9, hematocrit 26.7, MCV  57.5, platelets 489.   ASSESSMENT:  1.  Abdominal pain, nausea, vomiting and probable hematemesis.  The      differential diagnoses include gastritis, peptic ulcer disease,      esophagitis, Mallory-Weiss tear, cholecystitis, less likely pancreatitis      given normal lipase.  The patient probably has peptic ulcer disease or      gastritis secondary to nonsteroidal anti-inflammatory drugs and alcohol.  2.  Microcytic anemia.  The patient's mean corpuscular volume on admission      is 57.5 and her hemoglobin is 7.9.  The patient is a menstruating woman.      She does have a history of chronic anemia, however, in review of the      past medical history, her hemoglobin appears to be averaging at 9.5 to      10.5.  The anemia is probably secondary to chronic/acute/subacute      gastrointestinal blood loss, menstrual bleeding, less likely anemia of      chronic disease.  3.  Type 1 diabetes mellitus.  The patient's glucose is quite elevated on      admission.  The patient probably has poor compliance with medical      therapy.  4.  Left facial pain.  There is no evidence of pain on admission, no obvious      oropharyngeal mass, edema or erythema.  5.  Hypokalemia.  The hypokalemia is probably secondary to vomiting and      hyperglycemia.   PLAN:  1.  The patient has been typed and crossed 2 units of packed red blood      cells.  The units will be transfused today.  2.  Will check a hemoglobin and hematocrit 1 hour following transfusion.      Will check a CBC in the morning.  3.  Will start Protonix 40 mg IV q.12h. and Reglan 5 mg IV  q.6h.  4.  Phenergan IV will be started as well.  5.  Volume repletion with D-5 normal saline with potassium chloride added.  6.  Will replete potassium intravenously.  7.  GI consultation, [facility] has been consulted.  8.  Treat the patient's pain with morphine as needed.  Ativan IV as needed      for agitation and/or anxiety.  9.  Will given thiamine 100 mg IV x1 and then continue multivitamin with      iron in the IV fluids daily.  10. Will check iron studies, TSH, hemoglobin A1C, PT/PTT and magnesium.  11. Will treat elevated blood sugars with NPH twice daily and a sliding      scale insulin regimen.  12. Will keep the patient NPO for now.  13. Will check an ultrasound of the abdomen to rule out hepatobiliary      disease.       DF/MEDQ  D:  03/21/2005  T:  03/21/2005  Job:  [id number]   cc:   Maurice March, M.D.  [address] [place]  [place] [address]  Fax: [phone]

## 2011-01-04 NOTE — Discharge Summary (Signed)
[facility]  Patient:    Nancy Singh, Nancy Singh                    MRN: [id number] Adm. Date:  [id number] Disc. Date: [id number] Attending:  Benny Lennert                           Discharge Summary  FINAL DIAGNOSES: 1. Intractable nausea and vomiting probably secondary to alcohol-related    gastritis. 2. Chronic alcoholism. 3. Anemia. 4. Insulin-dependent diabetes mellitus, poorly controlled.  HISTORY OF PRESENT ILLNESS:  Patient is a 50 year old black female with a history of insulin-dependent diabetes of 18 years duration.  She was last admitted to the hospital approximately two weeks prior for dehydration and intractable nausea and vomiting.  On the evening prior to admission, the patient began drinking heavily, had epigastric discomfort and subsequently uncontrolled nausea and vomiting.  She presented to the emergency room where her alcohol level was 214, potassium 2.0 and the patient was subsequently admitted for evaluation and treatment of her intractable nausea and vomiting and her secondary electrolyte abnormalities.  LABORATORY DATA AND HOSPITAL COURSE:  Patient was admitted to the hospital where she was supported with IV fluids and received potassium supplementation. Serial blood sugars were monitored carefully and she was controlled with a regimen of Humulin R.  Laboratory studies included a WBC of 8000, hemoglobin 10.7 and hematocrit 34.9.  Potassium on admission was 2.0.  Over the next 24 hours, potassium level improved to 3.6.  Blood sugars ranged from 313 to 155. BUN was less than 5, creatinine 0.5 to 0.9.  Magnesium level was decreased at 1.1.  Hemoglobin A1c was 8.1.  Drug screen was unremarkable and alcohol level 214.  Urinalysis negative.  On the second hospital day, a psychiatric consult was requested but the patient refused to participate.  She was initially made n.p.o. but on the second hospital day, her diet was advanced to  an 1800-calorie ADA.  During the hospital admission, she was treated with IV fluids and antiemetics only.  On the second hospital day, the patient declined a psychiatric evaluation for her chronic alcoholism and left the hospital against medical advice.  Situation was discussed with the patient and she was made aware of the fact that since she was leaving AMA, that she must obtain followup primary care from an office other than [facility].  CONDITION ON DISCHARGE:  Improved. DD:  03/10/00 TD:  03/12/00 Job: [id number]

## 2011-01-04 NOTE — H&P (Signed)
NAMEMarland Kitchen  Nancy Singh, Nancy Singh NO.:  [id number]   MEDICAL RECORD NO.:  [id number]          PATIENT TYPE:  INP   LOCATION:  0104                         FACILITY:  [facility]   PHYSICIAN:  Lucita Ferrara, MD         DATE OF BIRTH:  05/31/1961   DATE OF ADMISSION:  06/03/2006  DATE OF DISCHARGE:                                HISTORY & PHYSICAL   HISTORY OF PRESENT ILLNESS:  The patient is a 50 year old female.  She has  had longstanding history of uncontrolled diabetes.  She presents to the  [facility] emergency room with complaint of stomach cramps,  nausea, dizziness, and intractable vomiting for the last few days.  The  vomiting started three days ago.  The patient had accompanying mid  epigastric pain. She tried some Pepto Bismol and Tums at home and she did  not get any relief.  She also complains of coffee ground emesis. She says  that she is vomiting some coffee ground colored material.  She has no fevers  or chills.  No diarrhea, no bright red blood per rectum.  Otherwise review  of systems is negative.  She has no chest pain or shortness of breath.  She  has no tingling, numbness on one side of the body.   PAST MEDICAL HISTORY:  1. Diabetes, insulin-dependent.  2. Hyperlipidemia.  3. Hypertension.   SOCIAL HISTORY:  She denies any drug abuse.  She is an occasional drinker  and no smoking.   ALLERGIES:  No known drug allergies.   MEDICATIONS:  1. Lipitor.  2. Lisinopril.  3. Protonix.  4. Insulin NPH.   PHYSICAL EXAMINATION:  GENERAL:  The patient is in no acute distress.  She  is a slender black female.  She seems like she is just uncomfortable with  her abdominal discomfort.  HEENT:  Normocephalic, atraumatic.  Sclerae anicteric.  Mucous membranes  moist.  NECK:  Supple.  No JVD.  No carotid bruits.  CARDIOVASCULAR:  S1 and S2.  Regular rate and rhythm.  No murmur, rubs or  clicks.  LUNGS:  Clear to auscultation bilaterally.  No wheezes.  No  rhonchi.  ABDOMEN:  Soft, nontender, not distended.  Positive bowel sounds.  RECTAL:  Guaiac positive.  No hemorrhoids detected.  EXTREMITIES:  No clubbing, cyanosis or edema.  NEUROLOGIC: The patient is alert and oriented x3.  Cranial nerves II-XII  grossly intact.   LABORATORY DATA:  The patient presented to the emergency room.  A full work-  up was done.  The patient's laboratory data shows ABGs to be pH 7.5, pCO2  37.9, pO2 117, base deficit 11.  The patient's blood sugar was elevated here  in the emergency room. At one point it was 430 and above but with IV  hydration, the patient's blood sugar returned back to normal.  Other  laboratory data: Sodium 130, potassium 3.1, chloride 84, CO2 35, glucose 435  but it decreased down to 80, calcium 8.6, AST 11, ALT 17, alk phos 66.  CBC  showed white count 7.8, hemoglobin 8.2, hematocrit 25.9, platelets  282.   The patient was given IV Zofran and intravenous hydration in the emergency  room.  EKG showed normal sinus rhythm. No ST-T wave changes.  No LVH.   ASSESSMENT/PLAN:  This is a 50 year old female with insulin-dependent  diabetes mellitus presents to the emergency room with abdominal pain, nausea  and vomiting and high blood sugars.  Blood sugars did return to normal after  IV hydration.  Dr. Ignacia Palma wanted to admit the patient for observation as  he felt that it was necessary to observe the patient to make sure that the  patient has good hemodynamic status.  The patient seems pretty stable to me.  Will go ahead and admit the patient.  Will get hemoglobin and hematocrit  q.6h. Will go ahead and type and cross two units of packed red blood cells  and will hold that. I am going to go ahead and start her on IV Protonix.  For her insulin-dependent diabetes, I am going to go ahead and start her on  sliding scale insulin.  For her hyperlipidemia we will go ahead and continue  her Lipitor. The patient's blood pressure currently is pretty good  and she  is not hypo or hypertensive.  I am going to go ahead and put her back on her  lisinopril  holding parameters. I think this will probably be a short  observation for this patient.      Lucita Ferrara, MD  Electronically Signed     RR/MEDQ  D:  06/04/2006  T:  06/04/2006  Job:  [id number]

## 2011-03-21 ENCOUNTER — Inpatient Hospital Stay (HOSPITAL_COMMUNITY)
Admission: EM | Admit: 2011-03-21 | Discharge: 2011-03-22 | DRG: 639 | Discharge status: Against Medical Advice | Payer: Self-pay | Attending: Internal Medicine | Admitting: Internal Medicine

## 2011-03-21 ENCOUNTER — Encounter: Payer: Self-pay | Admitting: Internal Medicine

## 2011-03-21 DIAGNOSIS — E1010 Type 1 diabetes mellitus with ketoacidosis without coma: Principal | ICD-10-CM | POA: Diagnosis present

## 2011-03-21 DIAGNOSIS — R112 Nausea with vomiting, unspecified: Secondary | ICD-10-CM

## 2011-03-21 DIAGNOSIS — I10 Essential (primary) hypertension: Secondary | ICD-10-CM | POA: Diagnosis present

## 2011-03-21 DIAGNOSIS — K208 Other esophagitis without bleeding: Secondary | ICD-10-CM | POA: Diagnosis present

## 2011-03-21 DIAGNOSIS — E785 Hyperlipidemia, unspecified: Secondary | ICD-10-CM | POA: Diagnosis present

## 2011-03-21 DIAGNOSIS — D509 Iron deficiency anemia, unspecified: Secondary | ICD-10-CM | POA: Diagnosis present

## 2011-03-21 DIAGNOSIS — R7309 Other abnormal glucose: Secondary | ICD-10-CM

## 2011-03-21 LAB — URINALYSIS, ROUTINE W REFLEX MICROSCOPIC
Bilirubin Urine: NEGATIVE
Ketones, ur: 40 mg/dL — AB
Nitrite: NEGATIVE
Protein, ur: NEGATIVE mg/dL
Urobilinogen, UA: 0.2 mg/dL (ref 0.0–1.0)

## 2011-03-21 LAB — GLUCOSE, CAPILLARY
Glucose-Capillary: 227 mg/dL — ABNORMAL HIGH (ref 70–99)
Glucose-Capillary: 476 mg/dL — ABNORMAL HIGH (ref 70–99)
Glucose-Capillary: 478 mg/dL — ABNORMAL HIGH (ref 70–99)

## 2011-03-21 LAB — COMPREHENSIVE METABOLIC PANEL
Alkaline Phosphatase: 76 U/L (ref 39–117)
BUN: 14 mg/dL (ref 6–23)
Chloride: 91 mEq/L — ABNORMAL LOW (ref 96–112)
GFR calc Af Amer: >60 mL/min (ref >60)
Glucose, Bld: 500 mg/dL — ABNORMAL HIGH (ref 70–99)
Potassium: 4.7 mEq/L (ref 3.5–5.1)
Total Bilirubin: 0.4 mg/dL (ref 0.3–1.2)
Total Protein: 7.1 g/dL (ref 6.0–8.3)

## 2011-03-21 LAB — DIFFERENTIAL
Basophils Absolute: 0 10*3/uL (ref 0.0–0.1)
Lymphocytes Relative: 16 % (ref 12–46)
Lymphs Abs: 0.9 10*3/uL (ref 0.7–4.0)
Monocytes Absolute: 0.2 10*3/uL (ref 0.1–1.0)
Neutro Abs: 4.2 10*3/uL (ref 1.7–7.7)

## 2011-03-21 LAB — CBC
HCT: 34.1 % — ABNORMAL LOW (ref 36.0–46.0)
Hemoglobin: 11.8 g/dL — ABNORMAL LOW (ref 12.0–15.0)
MCHC: 34.6 g/dL (ref 30.0–36.0)

## 2011-03-21 LAB — BASIC METABOLIC PANEL
CO2: 19 mEq/L (ref 19–32)
GFR calc non Af Amer: 52 mL/min — ABNORMAL LOW (ref >60)
Glucose, Bld: 526 mg/dL — ABNORMAL HIGH (ref 70–99)
Potassium: 5.2 mEq/L — ABNORMAL HIGH (ref 3.5–5.1)
Sodium: 130 mEq/L — ABNORMAL LOW (ref 135–145)

## 2011-03-21 LAB — RAPID URINE DRUG SCREEN, HOSP PERFORMED
Benzodiazepines: NOT DETECTED
Cocaine: NOT DETECTED

## 2011-03-21 LAB — LIPASE, BLOOD: Lipase: 14 U/L (ref 11–59)

## 2011-03-21 NOTE — H&P (Addendum)
Hospital Admission Note Date: 03/21/2011  Patient name: Nancy Singh Medical record number: [id number] Date of birth: 13-Apr-1961 Age: 50 y.o. Gender: female PCP: Previously seen at [facility], currently no PCP   Medical Service: Cecille Rubin  Attending physician:  Dr. Tilford Pillar  Resident [phone]):  Dr. Bethel Born  Pager: [phone] Resident (R1):  Dr. Blanca Friend  Pager: [phone]  Chief Complaint: Nausea, vomiting, weakness  History of Present Illness: Nancy Singh is a 50 year old woman with Type 1 DM, chronic esophagitis, and chronic anemia who presents with a one to two day history of nausea and vomiting.  She reports difficulty keeping food down, but she has kept trying to eat and drink.  She does believe that some of the vomit has been notably dark.  She has felt diffusely weak for the past week, worsening the last two days.  She has had a few episodes of diarrhea in the last few days as well, and sometimes notes that her stools may have red blood on occasion.  She reports taking insulin 70/30 at home, "about 10 units per day".  However, she has difficulty affording her medicine and is sometimes out of it for at least several days at a time.  She says she last took her 70/30 insulin yesterday morning.  She is not followed by a physician for her diabetes, last seeing Dr. Audria Nine over a year ago for her last outpatient visit.    She denies fevers, chills, dysuria, hematuria, flank pain, headache, or chest pain.  She reports a mild cough for some months, but it has improved recently.    She has a history of multiple admissions for diabetic ketoacidosis, most recently in November 2011.  Her last HbA1c on record was in November 2011 and was 8.6%.  She has some numbness and tingling in her hands and feet that has been a problem for about a year.    She has a history of chronic esophagitis confirmed as [place] Grade D Esophagitis by EGD in November 2011.  She also has a history of anemia  thought to be iron deficiency anemia from chronic blood loss due to GI losses and menstrual losses.  She takes iron therapy daily.  We could not complete a thorough past medical history due to Ms. Chauca's somnalence on presentation.    Meds: Insulin 70/30 10 units daily subQ (approximate) Iron Sulfate 325mg  daily Patient reports taking no other home medications  Allergies: No known drug allergies   Past Medical History: 1. Type 1 diabetes mellitus. 2. Chronic anemia, which is iron-deficiency anemia, thought to be secondary to GI     bleeding and menstrual blood loss. 3. Hypertension. 4. Hyperlipidemia.  -Last lipid panel on file January 2011: Tot Chol 228, LDL 132, HDL 84, Triglyceride 60 5. Esophagitis.  -EGD performed November 2011: [place] Grade D Esophagitis 6. Psoriasis.   Family History: Diabetes in her mother and brother Heart disease in her mother and brother No family history of cancer.    Social History:  Today she denies any recent drinking.  Says her last drink was about a year ago.  However, she also denies a history of heavy drinking, but a history of alcohol abuse is noted in her record. Does not smoke. Denies any history of illicit drug use.  She lives alone.  She has no family in the area.      Review of Systems: See HPI We could not complete a thorough ROS due to Ms. Suares's  somnalence on presentation.   Physical Exam: T 97.0, BP 152/83, P 83, R 20 General: somnalent, mildly uncomfortable, but cooperative to examination.  Head: normocephalic and atraumatic.  Eyes: vision grossly intact, pupils equal, pupils round, pupils reactive to light, no injection and anicteric.  Mouth: pharynx pink and moist, no erythema, and no exudates.  Neck: no JVD, and no carotid bruits.  Lungs: normal respiratory effort, no accessory muscle use, normal breath sounds, no crackles, and no wheezes. Heart: normal rate, regular rhythm, no murmur, no gallop, and no rub.    Abdomen: soft, tender to deep palpation in epigastric area otherwise non-tender to palpation, normal bowel sounds, no distention, no guarding, no rebound tenderness. Msk: no joint swelling, no joint warmth, and no redness over joints.  Pulses: 2+ DP/PT pulses bilaterally Extremities: No cyanosis, clubbing, edema Neurologic: alert & oriented X3, cranial nerves II-XII intact, strength normal in all extremities. Skin: turgor normal.  Patchy scales on legs, non-pruritic, non-erythematous.  Also present on extensor surfaces of elbows as smaller lesions. Psych: Oriented X3, memory intact for recent and remote, normally interactive, good eye contact, not anxious appearing, and not depressed appearing.   Lab results:   Result Name                              Result        Normal Range     Units      Perf. Loc.  Sodium (NA)                              132        l      135-145          mEq/L  Potassium (K)                            4.7               3.5-5.1          mEq/L  Chloride                                      91         l      96-112           mEq/L  CO2                                           23                19-32            mEq/L  Glucose                                    500        h      70-99            mg/dL  BUN  14                6-23             mg/dL  Creatinine                                 0.93              0.50-1.10        mg/dL  GFR, Est Non African American            >60               >60              mL/min  GFR, Est African American                >60               >60              mL/min      Bilirubin, Total                         0.4               0.3-1.2          mg/dL  Alkaline Phosphatase               76                39-117           U/L  SGOT (AST)                             81         h      0-37             U/L  SGPT (ALT)                             29                0-35             U/L  Total  Protein                            7.1               6.0-8.3          g/dL  Albumin-Blood                          3.6               3.5-5.2          g/dL  Calcium                                    8.9               8.4-10.5         mg/dL   Lipase  14              11-59            U/L WBC                                         5.5               4.0-10.5         K/uL  RBC                                         4.19              3.87-5.11        MIL/uL  Hemoglobin (HGB)                  11.8       l      12.0-15.0        g/dL  Hematocrit (HCT)                    34.1       l      36.0-46.0        %  MCV                                         81.4              78.0-100.0       fL  MCH -                                      28.2              26.0-34.0        pg  MCHC                                      34.6              30.0-36.0        g/dL  RDW                                       14.5              11.5-15.5        %  Platelet Count (PLT)                272               150-400          K/uL  Neutrophils, %                         77                43-77            %  Lymphocytes, %  16                12-46            %  Monocytes, %                          4                 3-12             %  Eosinophils, %                         3                 0-5              %  Basophils, %                             1                 0-1              %  Neutrophils, Absolute               4.2               1.7-7.7          K/uL  Lymphocytes, Absolute           0.9               0.7-4.0          K/uL  Monocytes, Absolute               0.2               0.1-1.0          K/uL  Eosinophils, Absolute              0.2               0.0-0.7          K/uL  Basophils, Absolute                0.0               0.0-0.1          K/uL   Urinalysis: Findings  Result Name                              Result    Normal Range     Units      Perf. Loc.  Color,  Urine                             YELLOW            YELLOW  Appearance                               CLEAR             CLEAR  Specific Gravity                         1.025  1.005-1.030  pH                                        6.5               5.0-8.0  Urine Glucose                            >1000      a      NEG              mg/dL  Bilirubin                                 NEGATIVE          NEG  Ketones                                  40         a      NEG              mg/dL  Blood                                    NEGATIVE          NEG  Protein                                  NEGATIVE          NEG              mg/dL  Urobilinogen                             0.2               0.0-1.0          mg/dL  Nitrite                                  NEGATIVE          NEG  Leukocytes                               NEGATIVE          NEG   Result Name                              Result     Abnl   Normal Range     Units      Perf. Loc.  Squamous Epithelial / LPF                RARE              RARE  WBC / HPF  0-2               <3               WBC/hpf  RBC / HPF                                0-2               <3               RBC/hpf  Urine-Other                              RARE YEAST    Assessment & Plan by Problem:  1) Diabetic Ketoacidosis:  Anion Gap 18, glucose 500, UA positive for ketones.  Her nausea, vomiting, and weakness are very characteristic of DKA as the cause of her anion gap metabolic acidosis.  Normal bicarb indicates likely concurrent metabolic alkalosis, likely due to vomiting.  Low chloride fits with vomiting as the cause.  Since she has no signs or symptoms of concurrent infection/illness, she has likely developed DKA due medication non-adherence.   Plan: Admit to telemetry NPO overnight ABG to confirm diagnoses Fluid boluses given in the ED Insulin drip per DKA protocol.  NS 200 cc/hr for now.  Change to D5 1/2NS at 100 cc/hr  once glucose is below 250.   KCl x 2 BMET Q2H x 2, then Q4H HbA1c Will get social work involved to explore ways to improve adherence/diabetes management in the future   2) Chronic Esophagitis: Due to her chronic esophagitis, she is more susceptible to GI bleeding in the setting of frequent vomiting.  Her descriptions of dark vomit and possible blood in stools are concerning, but her Hgb of 11.8 is actually above her baseline.    Plan: Protonix 40mg  BID FOBT x 3 to evaluate for blood in stools   3) Chronic Anemia Hgb 11.8.  Last Hgb 13.8 in April, but previous Hgb values from 2011 between 7.0 and 9.0.  Hemoglobinopathy panel negative for Hgb S and Hgb F in November 2011.   Will discontinue iron therapy for now in the setting of nausea and vomiting.  Restart iron sulfate 325mg  daily on or before discharge.     4) Hypertension Not followed by a physician as an outpatient, so not on any BP medications.  Will hold off on starting blood pressure medications in the setting of diabetic ketoacidosis due to risk of increased electrolyte abnormalities.  Consider starting BP medication before discharge.     5) Hyperlipidemia Not followed by a physician as an outpatient, so not on any medication.  Will hold off on starting statin for now.  6) Possible history of alcohol abuse Will get a urine drug screen as well as urine alcohol level.  7) DVT Prophylaxis:  Lovenox daily       R2/3______________________________      R1________________________________  ATTENDING: I performed and/or observed a history and physical examination of the patient.  I discussed the case with the residents as noted and reviewed the residents' notes.  I agree with the findings and plan--please refer to the attending physician note for more details.  Signature________________________________  Printed Name_____________________________

## 2011-03-22 LAB — BASIC METABOLIC PANEL
BUN: 20 mg/dL (ref 6–23)
BUN: 18 mg/dL (ref 6–23)
BUN: 14 mg/dL (ref 6–23)
CO2: 15 mEq/L — ABNORMAL LOW (ref 19–32)
CO2: 23 mEq/L (ref 19–32)
CO2: 22 mEq/L (ref 19–32)
Calcium: 7.4 mg/dL — ABNORMAL LOW (ref 8.4–10.5)
Calcium: 7.7 mg/dL — ABNORMAL LOW (ref 8.4–10.5)
Calcium: 7.7 mg/dL — ABNORMAL LOW (ref 8.4–10.5)
Chloride: 94 mEq/L — ABNORMAL LOW (ref 96–112)
Chloride: 102 mEq/L (ref 96–112)
Chloride: 101 mEq/L (ref 96–112)
Creatinine, Ser: 1.09 mg/dL (ref 0.50–1.10)
Creatinine, Ser: 1.05 mg/dL (ref 0.50–1.10)
Creatinine, Ser: 0.97 mg/dL (ref 0.50–1.10)
GFR calc Af Amer: >60 mL/min (ref >60)
GFR calc Af Amer: >60 mL/min (ref >60)
GFR calc Af Amer: >60 mL/min (ref >60)
GFR calc Af Amer: >60 mL/min (ref >60)
GFR calc non Af Amer: 57 mL/min — ABNORMAL LOW (ref >60)
GFR calc non Af Amer: 56 mL/min — ABNORMAL LOW (ref >60)
Glucose, Bld: 356 mg/dL — ABNORMAL HIGH (ref 70–99)
Glucose, Bld: 163 mg/dL — ABNORMAL HIGH (ref 70–99)
Glucose, Bld: 137 mg/dL — ABNORMAL HIGH (ref 70–99)
Potassium: 3.9 mEq/L (ref 3.5–5.1)
Potassium: 3.8 mEq/L (ref 3.5–5.1)
Potassium: 3.7 mEq/L (ref 3.5–5.1)
Potassium: 3.5 mEq/L (ref 3.5–5.1)
Sodium: 132 mEq/L — ABNORMAL LOW (ref 135–145)
Sodium: 135 mEq/L (ref 135–145)

## 2011-03-22 LAB — GLUCOSE, CAPILLARY
Glucose-Capillary: 160 mg/dL — ABNORMAL HIGH (ref 70–99)
Glucose-Capillary: 207 mg/dL — ABNORMAL HIGH (ref 70–99)
Glucose-Capillary: 219 mg/dL — ABNORMAL HIGH (ref 70–99)
Glucose-Capillary: 401 mg/dL — ABNORMAL HIGH (ref 70–99)
Glucose-Capillary: 181 mg/dL — ABNORMAL HIGH (ref 70–99)
Glucose-Capillary: 193 mg/dL — ABNORMAL HIGH (ref 70–99)
Glucose-Capillary: 180 mg/dL — ABNORMAL HIGH (ref 70–99)
Glucose-Capillary: 212 mg/dL — ABNORMAL HIGH (ref 70–99)
Glucose-Capillary: 487 mg/dL — ABNORMAL HIGH (ref 70–99)
Glucose-Capillary: 173 mg/dL — ABNORMAL HIGH (ref 70–99)

## 2011-03-22 LAB — ETHANOL: Alcohol, Ethyl (B): <11 mg/dL (ref 0–11)

## 2011-03-22 LAB — CBC
HCT: 31.4 % — ABNORMAL LOW (ref 36.0–46.0)
Hemoglobin: 10.6 g/dL — ABNORMAL LOW (ref 12.0–15.0)
MCH: 27.8 pg (ref 26.0–34.0)
MCHC: 33.8 g/dL (ref 30.0–36.0)
MCV: 82.4 fL (ref 78.0–100.0)
RDW: 15 % (ref 11.5–15.5)

## 2011-03-22 LAB — HEMOGLOBIN A1C: Mean Plasma Glucose: 235 mg/dL — ABNORMAL HIGH (ref <117)

## 2011-03-25 NOTE — Discharge Summary (Signed)
Nancy Singh, SPIES NO.:  [id number]  MEDICAL RECORD NO.:  [id number]  LOCATION:  6703                         FACILITY:  [facility]  PHYSICIAN:  Tilford Pillar, MD     DATE OF BIRTH:  1960/11/03  DATE OF ADMISSION:  03/21/2011 DATE OF DISCHARGE:  03/22/2011                              DISCHARGE SUMMARY   DISCHARGE DIAGNOSES: 1. Diabetic ketoacidosis. 2. Chronic esophagitis:  (proven EGD in November 2011). 3. Chronic anemia:  Past hemoglobinopathy panel negative for     hemoglobin F and hemoglobin S. 4. Hypertension. 5. Hyperlipidemia:  Last lipid panel on January 2011.  Total     cholesterol 228, LDL 132, HDL 84, triglycerides 60.  DISPOSITION AND FOLLOWUP:  This patient has not seen an outpatient physician in the year, so we had scheduled followup appointments for her at the [facility] for diabetes followup and see if she could get Medicaid.  We also intended to give her the number for the [facility] to schedule followup there herself instead as she expressed preference to go there instead. However, she left against medical advice and refused to take her sheet of followup appointments with her.  DISCHARGE MEDICATIONS:  The patient left against medical advice. Prescription for 70/30 insulin was offered, but she explained that she had access to this already and did not want a prescription.  ADMITTING HISTORY AND PHYSICAL:  [name] is a 50 year old woman with type 1 diabetes mellitus, chronic esophagitis, and chronic anemia who presents with a 1-2 day history of nausea and vomiting.  She reports difficulty keeping food down, but she has kept trying to eat and drink. She does believe that some of the vomit has been notably dark.  She has felt diffusely weak for the past week, worsening in the last few days. She has had a few episodes of diarrhea in the last few days as well and sometimes notice that her  stools may have red blood on occasion.  She reports taking the insulin 70/30 at home about 10 units per day. However, she has difficulty affording her medicine and sometimes out of it for several days at a time.  She says she last took her 70/30 insulin yesterday morning.  She is not followed by a physician for diabetes, last seeing Dr. Audria Nine over a year ago as her last outpatient visit. She denied fevers, chills, dysuria, hematuria, flank pain, headache, or chest pain.  She reports some mild cough for some months, but that has improved recently. She had a history of multiple admissions for diabetic ketoacidosis, most recently June 20, 2010.  Her last hemoglobin A1c on record was from November 2011 was 8.6%.  She has some numbness and tingling in her hand and feet that have been a problem for about a year. She has a history of chronic esophagitis confirmed as [place] grading class D esophagitis by EGD in November 2011.  She has a history of anemia thought to be from iron deficiency anemia from chronic blood loss due to GI losses and menstrual losses.  She takes iron therapy daily.  ADMITTING HISTORY AND PHYSICAL:  VITAL SIGNS:  Temperature  97.0, blood pressure 152/83, pulse 83, respirations 20. GENERAL:  Somnolent, mildly uncomfortable, but cooperative to examination. HEAD:  Normocephalic and atraumatic. EYES:  Vision grossly intact.  Pupils equal, round, reactive to light, no injection and anicteric. MOUTH:  Pharynx pink and moist, no erythema, no exudates. NECK:  No JVD and no carotid bruits. LUNGS:  Clear to auscultation bilaterally.  Normal respiratory effort and no accessory muscle use. HEART:  Regular rate and rhythm.  No murmurs, rubs, or gallops. ABDOMEN:  Soft, tender to deep palpation in the epigastric area, but otherwise nontender to palpation, normal bowel sounds, no distention, no guarding, no rebound tenderness. MSK:  No joint swelling.  No joint warmth and  no redness over joints. PULSES:  2+ dorsalis pedis and posterior tibial pulses bilaterally. EXTREMITIES:  No cyanosis, clubbing, or edema. NEUROLOGIC:  Alert and oriented x3, cranial nerves II through XII intact, strength normal in all extremities. SKIN:  Turgor normal.  Patchy scales on legs, nonpruritic, nonerythematous.  Also present on extensor surfaces, all those are smaller lesions. PSYCH:  Oriented x3, memory intact for recent and remote, normally interactive, good eye contact, non-anxious appearing, and not depressed appearing.  LABORATORY DATA ON ADMISSION:  Comprehensive metabolic panel:  Sodium 132, potassium 4.7, chloride 91, CO2 of 23, glucose 500, BUN 14, creatinine 0.93, total bilirubin 0.4, alkaline phosphatase 76, AST 81, ALT 29, total protein 7.1, albumin 3.6, calcium 8.9.  Lipase 14.  CBC: Hemoglobin 11.8, hematocrit 34.1, white blood cells 5.5, MCV 81.4, platelets 272.  Urinalysis:  PH 6.5, yellow, clear, specific gravity 1.025, glucose greater than 1000, ketones 40, nitrite negative, leukocytes negative.  HOSPITAL COURSE: 1. Diabetic ketoacidosis:  Upon admission, anion gap 18, glucose 500,     urine positive for ketones.  The patient was made n.p.o. and given     2 L of fluid (NS) boluses on the floor and started on insulin drip.     Glucose was below 250, fluid was changed to D5 1/2 normal saline.     By the morning of admission, her anion gap had closed to 9, and her     blood sugars were between 200 and 125.  Normal diabetic diet was     ordered.  She kept down food without vomiting, and she was given 10     units NPH subcutaneously.  The care team's plan at this point was to     attempt to stabilize her on subcutaneous insulin over the next day     in the hospital.  However, when the insulin drip was stopped an     hour later, she decided to leave the hospital against medical     advice.  The risks of this were explained to her, and she     acknowledged to  accept these risks.  She reiterated her desire to leave     and left the hospital. 2. Chronic esophagitis:  Iron therapy was discontinued as an inpatient     to decrease GI irritation in the setting of nausea and vomiting.     Fecal occult blood tests were ordered.  However, as result of her     leaving hospital against medical advise, these tests were never     performed. 3. Anemia.  Hemoglobin on admission was 11.8, down from 13.8 in April     2012, but increased for values between 27.0 and 9.0 in 2011.  The     patient left against medical advice, so no further  testing or     treatment plan occurred. 4. Hypertension:  Blood pressure peaked to 161/93 while on the floor.     Medical treatment for hypertension was deferred in the setting of     electrolyte abnormalities seen in the setting of diabetic     ketoacidosis.  The patient left against medical advice before     therapy could be initiated. 5. Hyperlipidemia:  Medical treatment was deferred in the setting of     diabetic ketoacidosis.  The patient left against medical advice     before therapy could to be initiated.  DISCHARGE PHYSICAL EXAMINATION:  Most recent vital signs before the patient left hospital:  Temperature 97.8, blood pressure 126/78, pulse 78, respirations 22, O2 saturation 97% on room air.  The patient was much less somnolent on the morning after admission that she was on admission.  The remainder of the physical exam was unchanged.  DISCHARGE LABORATORY DATA:  Last BMP before the patient left the hospital:  Sodium 132, potassium 3.5, chloride 101, CO2 of 22, glucose 137, BUN 14, creatinine 0.9.    ______________________________ Blanca Friend, MD   ______________________________ Tilford Pillar, MD    BW/MEDQ  D:  03/24/2011  T:  03/24/2011  Job:  [id number]  Electronically Signed by Blanca Friend MD on 03/25/2011 08:24:18 AM Electronically Signed by Tilford Pillar  on 03/25/2011 11:46:53 AM

## 2011-05-21 ENCOUNTER — Emergency Department (HOSPITAL_COMMUNITY)
Admission: EM | Admit: 2011-05-21 | Discharge: 2011-05-21 | Disposition: A | Discharge status: Home / Self Care | Payer: Self-pay | Attending: Emergency Medicine | Admitting: Emergency Medicine

## 2011-05-21 DIAGNOSIS — E1169 Type 2 diabetes mellitus with other specified complication: Secondary | ICD-10-CM | POA: Insufficient documentation

## 2011-05-21 DIAGNOSIS — R1013 Epigastric pain: Secondary | ICD-10-CM | POA: Insufficient documentation

## 2011-05-21 DIAGNOSIS — K219 Gastro-esophageal reflux disease without esophagitis: Secondary | ICD-10-CM | POA: Insufficient documentation

## 2011-05-21 DIAGNOSIS — I10 Essential (primary) hypertension: Secondary | ICD-10-CM | POA: Insufficient documentation

## 2011-05-21 DIAGNOSIS — Z794 Long term (current) use of insulin: Secondary | ICD-10-CM | POA: Insufficient documentation

## 2011-05-21 DIAGNOSIS — R112 Nausea with vomiting, unspecified: Secondary | ICD-10-CM | POA: Insufficient documentation

## 2011-05-21 DIAGNOSIS — E785 Hyperlipidemia, unspecified: Secondary | ICD-10-CM | POA: Insufficient documentation

## 2011-05-21 LAB — DIFFERENTIAL
Basophils Relative: 0 % (ref 0–1)
Lymphs Abs: 1.8 10*3/uL (ref 0.7–4.0)
Monocytes Relative: 6 % (ref 3–12)
Neutro Abs: 3.1 10*3/uL (ref 1.7–7.7)
Neutrophils Relative %: 51 % (ref 43–77)

## 2011-05-21 LAB — COMPREHENSIVE METABOLIC PANEL
ALT: 6 U/L (ref 0–35)
AST: 16 U/L (ref 0–37)
Alkaline Phosphatase: 89 U/L (ref 39–117)
CO2: 29 mEq/L (ref 19–32)
Calcium: 9.5 mg/dL (ref 8.4–10.5)
Chloride: 92 mEq/L — ABNORMAL LOW (ref 96–112)
GFR calc Af Amer: 68 mL/min — ABNORMAL LOW (ref >90)
GFR calc non Af Amer: 59 mL/min — ABNORMAL LOW (ref >90)
Glucose, Bld: 374 mg/dL — ABNORMAL HIGH (ref 70–99)
Sodium: 131 mEq/L — ABNORMAL LOW (ref 135–145)
Total Bilirubin: 0.4 mg/dL (ref 0.3–1.2)

## 2011-05-21 LAB — POCT I-STAT, CHEM 8
BUN: 16 mg/dL (ref 6–23)
Chloride: 95 mEq/L — ABNORMAL LOW (ref 96–112)
HCT: 45 % (ref 36.0–46.0)
Sodium: 134 mEq/L — ABNORMAL LOW (ref 135–145)

## 2011-05-21 LAB — GLUCOSE, CAPILLARY: Glucose-Capillary: 373 mg/dL — ABNORMAL HIGH (ref 70–99)

## 2011-05-21 LAB — CBC
Hemoglobin: 13.8 g/dL (ref 12.0–15.0)
MCH: 27.2 pg (ref 26.0–34.0)
RBC: 5.08 MIL/uL (ref 3.87–5.11)
WBC: 6.1 10*3/uL (ref 4.0–10.5)

## 2011-05-22 ENCOUNTER — Emergency Department (HOSPITAL_COMMUNITY)
Admission: EM | Admit: 2011-05-22 | Discharge: 2011-05-22 | Disposition: A | Discharge status: Home / Self Care | Payer: Self-pay | Attending: Emergency Medicine | Admitting: Emergency Medicine

## 2011-05-22 DIAGNOSIS — E119 Type 2 diabetes mellitus without complications: Secondary | ICD-10-CM | POA: Insufficient documentation

## 2011-05-22 DIAGNOSIS — Z794 Long term (current) use of insulin: Secondary | ICD-10-CM | POA: Insufficient documentation

## 2011-05-22 DIAGNOSIS — R05 Cough: Secondary | ICD-10-CM | POA: Insufficient documentation

## 2011-05-22 DIAGNOSIS — R059 Cough, unspecified: Secondary | ICD-10-CM | POA: Insufficient documentation

## 2011-05-22 LAB — GLUCOSE, CAPILLARY

## 2011-05-28 LAB — URINALYSIS, ROUTINE W REFLEX MICROSCOPIC
Glucose, UA: NEGATIVE
Hgb urine dipstick: NEGATIVE
Leukocytes, UA: NEGATIVE
Nitrite: NEGATIVE
Protein, ur: 30 — AB
Specific Gravity, Urine: 1.022
Urobilinogen, UA: 0.2
pH: 5

## 2011-05-28 LAB — DIFFERENTIAL
Basophils Absolute: 0
Basophils Relative: 1
Eosinophils Absolute: 0.1 — ABNORMAL LOW
Eosinophils Relative: 3
Lymphocytes Relative: 50 — ABNORMAL HIGH
Lymphs Abs: 2.5
Monocytes Absolute: 0.6
Monocytes Relative: 12
Neutro Abs: 1.7
Neutrophils Relative %: 34 — ABNORMAL LOW

## 2011-05-28 LAB — COMPREHENSIVE METABOLIC PANEL
ALT: 7
AST: 18
Albumin: 3.4 — ABNORMAL LOW
Alkaline Phosphatase: 79
BUN: 51 — ABNORMAL HIGH
CO2: 38 — ABNORMAL HIGH
Calcium: 8.7
Chloride: 73 — CL
Creatinine, Ser: 2.96 — ABNORMAL HIGH
GFR calc Af Amer: 21 — ABNORMAL LOW
GFR calc non Af Amer: 17 — ABNORMAL LOW
Glucose, Bld: 249 — ABNORMAL HIGH
Potassium: 2.3 — CL
Sodium: 130 — ABNORMAL LOW
Total Bilirubin: 0.8
Total Protein: 6

## 2011-05-28 LAB — LIPID PANEL
HDL: 98
Total CHOL/HDL Ratio: 2.3

## 2011-05-28 LAB — CBC
HCT: 28.8 — ABNORMAL LOW
HCT: 30.3 — ABNORMAL LOW
Hemoglobin: 10.4 — ABNORMAL LOW
MCHC: 34.5
MCV: 86.4
MCV: 87.4
Platelets: 239
Platelets: 250
RBC: 3.47 — ABNORMAL LOW
RDW: 15.2
RDW: 15.4
WBC: 4.9

## 2011-05-28 LAB — BASIC METABOLIC PANEL
CO2: 41 — ABNORMAL HIGH
Calcium: 8.1 — ABNORMAL LOW
Calcium: 9.1
Creatinine, Ser: 2.85 — ABNORMAL HIGH
GFR calc Af Amer: 22 — ABNORMAL LOW
GFR calc Af Amer: 30 — ABNORMAL LOW
GFR calc non Af Amer: 18 — ABNORMAL LOW
GFR calc non Af Amer: 25 — ABNORMAL LOW
Glucose, Bld: 232 — ABNORMAL HIGH
Potassium: 3.4 — ABNORMAL LOW
Sodium: 131 — ABNORMAL LOW
Sodium: 134 — ABNORMAL LOW

## 2011-05-28 LAB — CHLORIDE, URINE, RANDOM: Chloride Urine: 10

## 2011-05-28 LAB — TSH: TSH: 0.968

## 2011-05-28 LAB — CALCIUM, URINE, RANDOM: Calcium, Ur: 2

## 2011-05-28 LAB — URINE MICROSCOPIC-ADD ON

## 2011-05-28 LAB — SODIUM, URINE, RANDOM: Sodium, Ur: 44

## 2011-05-28 LAB — MAGNESIUM: Magnesium: 1.8

## 2011-06-17 IMAGING — CR DG ABDOMEN 1V
1 series · 1 of 1 positions shown · non-contrast
Comparison: CT abdomen pelvis of 09/04/2009

CLINICAL DATA: Upper abdominal pain, nausea and vomiting, fever

ABDOMEN - 1 VIEW

[t abdomen supine]
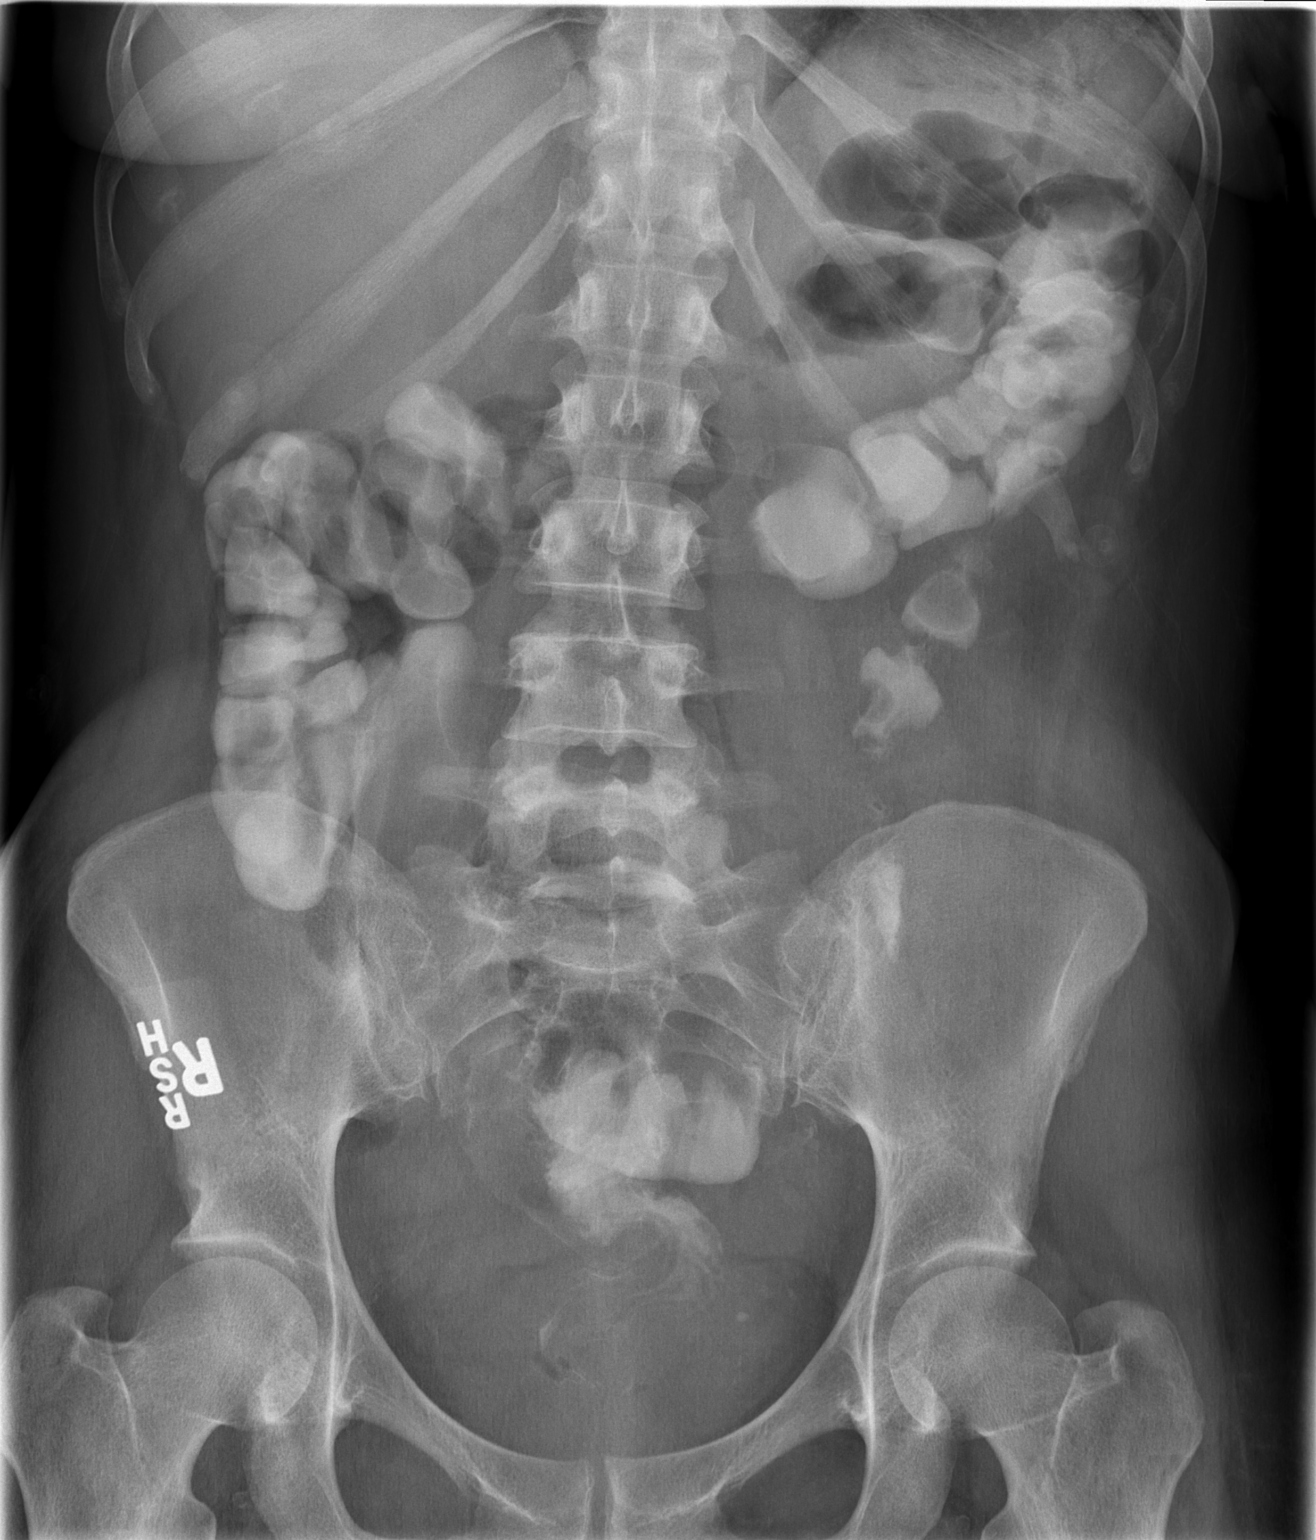

[1 of 1 positions shown; findings below may reference images not displayed]

FINDINGS: There is some contrast scattered throughout the colon
which is nondistended from recent CT of the abdomen pelvis.  The
bowel gas pattern is nonspecific.  No opaque calculi are seen.  No
bony abnormality is noted.
IMPRESSION: Nonspecific bowel gas pattern with some retained contrast
throughout the nondistended colon.

## 2012-01-27 ENCOUNTER — Inpatient Hospital Stay (HOSPITAL_COMMUNITY)
Admission: EM | Admit: 2012-01-27 | Discharge: 2012-01-30 | DRG: 638 | Disposition: A | Discharge status: Home / Self Care | Payer: Self-pay | Source: Ambulatory Visit | Attending: Internal Medicine | Admitting: Internal Medicine

## 2012-01-27 ENCOUNTER — Inpatient Hospital Stay (HOSPITAL_COMMUNITY): Payer: Self-pay

## 2012-01-27 ENCOUNTER — Encounter (HOSPITAL_COMMUNITY): Payer: Self-pay | Admitting: Emergency Medicine

## 2012-01-27 DIAGNOSIS — D649 Anemia, unspecified: Secondary | ICD-10-CM

## 2012-01-27 DIAGNOSIS — E1010 Type 1 diabetes mellitus with ketoacidosis without coma: Principal | ICD-10-CM | POA: Diagnosis present

## 2012-01-27 DIAGNOSIS — E7800 Pure hypercholesterolemia, unspecified: Secondary | ICD-10-CM | POA: Diagnosis present

## 2012-01-27 DIAGNOSIS — B9689 Other specified bacterial agents as the cause of diseases classified elsewhere: Secondary | ICD-10-CM

## 2012-01-27 DIAGNOSIS — E11319 Type 2 diabetes mellitus with unspecified diabetic retinopathy without macular edema: Secondary | ICD-10-CM

## 2012-01-27 DIAGNOSIS — Z794 Long term (current) use of insulin: Secondary | ICD-10-CM

## 2012-01-27 DIAGNOSIS — R112 Nausea with vomiting, unspecified: Secondary | ICD-10-CM | POA: Diagnosis present

## 2012-01-27 DIAGNOSIS — F1010 Alcohol abuse, uncomplicated: Secondary | ICD-10-CM | POA: Diagnosis present

## 2012-01-27 DIAGNOSIS — F341 Dysthymic disorder: Secondary | ICD-10-CM | POA: Diagnosis present

## 2012-01-27 DIAGNOSIS — B373 Candidiasis of vulva and vagina: Secondary | ICD-10-CM | POA: Diagnosis present

## 2012-01-27 DIAGNOSIS — E860 Dehydration: Secondary | ICD-10-CM | POA: Diagnosis present

## 2012-01-27 DIAGNOSIS — R739 Hyperglycemia, unspecified: Secondary | ICD-10-CM

## 2012-01-27 DIAGNOSIS — Z91199 Patient's noncompliance with other medical treatment and regimen due to unspecified reason: Secondary | ICD-10-CM

## 2012-01-27 DIAGNOSIS — J019 Acute sinusitis, unspecified: Secondary | ICD-10-CM | POA: Diagnosis present

## 2012-01-27 DIAGNOSIS — E1065 Type 1 diabetes mellitus with hyperglycemia: Secondary | ICD-10-CM

## 2012-01-27 DIAGNOSIS — E1110 Type 2 diabetes mellitus with ketoacidosis without coma: Secondary | ICD-10-CM | POA: Diagnosis present

## 2012-01-27 DIAGNOSIS — N390 Urinary tract infection, site not specified: Secondary | ICD-10-CM | POA: Diagnosis present

## 2012-01-27 DIAGNOSIS — J309 Allergic rhinitis, unspecified: Secondary | ICD-10-CM | POA: Diagnosis present

## 2012-01-27 DIAGNOSIS — R7989 Other specified abnormal findings of blood chemistry: Secondary | ICD-10-CM

## 2012-01-27 DIAGNOSIS — E109 Type 1 diabetes mellitus without complications: Secondary | ICD-10-CM

## 2012-01-27 DIAGNOSIS — R1110 Vomiting, unspecified: Secondary | ICD-10-CM

## 2012-01-27 DIAGNOSIS — D539 Nutritional anemia, unspecified: Secondary | ICD-10-CM

## 2012-01-27 DIAGNOSIS — F17200 Nicotine dependence, unspecified, uncomplicated: Secondary | ICD-10-CM | POA: Diagnosis present

## 2012-01-27 DIAGNOSIS — Z9119 Patient's noncompliance with other medical treatment and regimen: Secondary | ICD-10-CM

## 2012-01-27 DIAGNOSIS — E1039 Type 1 diabetes mellitus with other diabetic ophthalmic complication: Secondary | ICD-10-CM | POA: Insufficient documentation

## 2012-01-27 DIAGNOSIS — I10 Essential (primary) hypertension: Secondary | ICD-10-CM | POA: Diagnosis present

## 2012-01-27 DIAGNOSIS — R64 Cachexia: Secondary | ICD-10-CM

## 2012-01-27 DIAGNOSIS — R300 Dysuria: Secondary | ICD-10-CM | POA: Diagnosis present

## 2012-01-27 DIAGNOSIS — L408 Other psoriasis: Secondary | ICD-10-CM

## 2012-01-27 DIAGNOSIS — E10319 Type 1 diabetes mellitus with unspecified diabetic retinopathy without macular edema: Secondary | ICD-10-CM | POA: Diagnosis present

## 2012-01-27 DIAGNOSIS — K219 Gastro-esophageal reflux disease without esophagitis: Secondary | ICD-10-CM | POA: Diagnosis present

## 2012-01-27 DIAGNOSIS — B3731 Acute candidiasis of vulva and vagina: Secondary | ICD-10-CM | POA: Diagnosis present

## 2012-01-27 DIAGNOSIS — D500 Iron deficiency anemia secondary to blood loss (chronic): Secondary | ICD-10-CM | POA: Diagnosis present

## 2012-01-27 DIAGNOSIS — D62 Acute posthemorrhagic anemia: Secondary | ICD-10-CM | POA: Diagnosis present

## 2012-01-27 HISTORY — DX: Pure hypercholesterolemia, unspecified: E78.00

## 2012-01-27 HISTORY — DX: Cachexia: R64

## 2012-01-27 HISTORY — DX: Psoriasis, unspecified: L40.9

## 2012-01-27 HISTORY — DX: Type 1 diabetes mellitus with other diabetic ophthalmic complication: E10.39

## 2012-01-27 HISTORY — DX: Allergic rhinitis, unspecified: J30.9

## 2012-01-27 HISTORY — DX: Hyperglycemia, unspecified: R73.9

## 2012-01-27 HISTORY — DX: Anemia, unspecified: D64.9

## 2012-01-27 HISTORY — DX: Excessive and frequent menstruation with regular cycle: N92.0

## 2012-01-27 HISTORY — DX: Gastro-esophageal reflux disease without esophagitis: K21.9

## 2012-01-27 HISTORY — DX: Type 1 diabetes mellitus with unspecified diabetic retinopathy without macular edema: E10.319

## 2012-01-27 LAB — GLUCOSE, CAPILLARY
Glucose-Capillary: 140 mg/dL — ABNORMAL HIGH (ref 70–99)
Glucose-Capillary: 286 mg/dL — ABNORMAL HIGH (ref 70–99)
Glucose-Capillary: 296 mg/dL — ABNORMAL HIGH (ref 70–99)
Glucose-Capillary: 420 mg/dL — ABNORMAL HIGH (ref 70–99)
Glucose-Capillary: 506 mg/dL — ABNORMAL HIGH (ref 70–99)

## 2012-01-27 LAB — BASIC METABOLIC PANEL
BUN: 20 mg/dL (ref 6–23)
BUN: 20 mg/dL (ref 6–23)
CO2: 14 mEq/L — ABNORMAL LOW (ref 19–32)
CO2: 19 mEq/L (ref 19–32)
Calcium: 7.8 mg/dL — ABNORMAL LOW (ref 8.4–10.5)
Calcium: 8 mg/dL — ABNORMAL LOW (ref 8.4–10.5)
Chloride: 99 mEq/L (ref 96–112)
Chloride: 101 mEq/L (ref 96–112)
Creatinine, Ser: 0.94 mg/dL (ref 0.50–1.10)
Creatinine, Ser: 0.97 mg/dL (ref 0.50–1.10)
GFR calc Af Amer: 81 mL/min — ABNORMAL LOW (ref >90)
GFR calc Af Amer: 78 mL/min — ABNORMAL LOW (ref >90)
GFR calc non Af Amer: 70 mL/min — ABNORMAL LOW (ref >90)
GFR calc non Af Amer: 67 mL/min — ABNORMAL LOW (ref >90)
Glucose, Bld: 421 mg/dL — ABNORMAL HIGH (ref 70–99)
Glucose, Bld: 279 mg/dL — ABNORMAL HIGH (ref 70–99)
Potassium: 3.9 mEq/L (ref 3.5–5.1)
Potassium: 4 mEq/L (ref 3.5–5.1)
Sodium: 135 mEq/L (ref 135–145)
Sodium: 136 mEq/L (ref 135–145)

## 2012-01-27 LAB — DIFFERENTIAL
Basophils Absolute: 0.1 10*3/uL (ref 0.0–0.1)
Lymphocytes Relative: 7 % — ABNORMAL LOW (ref 12–46)
Monocytes Relative: 6 % (ref 3–12)
Smear Review: ADEQUATE

## 2012-01-27 LAB — RETICULOCYTES
RBC.: 4.81 MIL/uL (ref 3.87–5.11)
Retic Count, Absolute: 125.1 10*3/uL (ref 19.0–186.0)
Retic Ct Pct: 2.6 % (ref 0.4–3.1)

## 2012-01-27 LAB — CBC
MCV: 59.3 fL — ABNORMAL LOW (ref 78.0–100.0)
Platelets: 299 10*3/uL (ref 150–400)
Platelets: 291 10*3/uL (ref 150–400)
RBC: 4.81 MIL/uL (ref 3.87–5.11)
RDW: 19.9 % — ABNORMAL HIGH (ref 11.5–15.5)
RDW: 20 % — ABNORMAL HIGH (ref 11.5–15.5)
WBC: 14.8 10*3/uL — ABNORMAL HIGH (ref 4.0–10.5)
WBC: 15.6 10*3/uL — ABNORMAL HIGH (ref 4.0–10.5)

## 2012-01-27 LAB — PREPARE RBC (CROSSMATCH)

## 2012-01-27 LAB — LIPASE, BLOOD: Lipase: 8 U/L — ABNORMAL LOW (ref 11–59)

## 2012-01-27 LAB — MRSA PCR SCREENING: MRSA by PCR: NEGATIVE

## 2012-01-27 LAB — CARDIAC PANEL(CRET KIN+CKTOT+MB+TROPI): Relative Index: 2.7 — ABNORMAL HIGH (ref 0.0–2.5)

## 2012-01-27 LAB — COMPREHENSIVE METABOLIC PANEL
ALT: 10 U/L (ref 0–35)
Albumin: 3.7 g/dL (ref 3.5–5.2)
Alkaline Phosphatase: 100 U/L (ref 39–117)
Potassium: 4.1 mEq/L (ref 3.5–5.1)
Sodium: 134 mEq/L — ABNORMAL LOW (ref 135–145)
Total Protein: 8.2 g/dL (ref 6.0–8.3)

## 2012-01-27 LAB — URINE MICROSCOPIC-ADD ON

## 2012-01-27 LAB — URINALYSIS, ROUTINE W REFLEX MICROSCOPIC
Hgb urine dipstick: NEGATIVE
Nitrite: NEGATIVE
Specific Gravity, Urine: 1.028 (ref 1.005–1.030)
pH: 5.5 (ref 5.0–8.0)

## 2012-01-27 LAB — MAGNESIUM: Magnesium: 1.6 mg/dL (ref 1.5–2.5)

## 2012-01-27 LAB — LACTIC ACID, PLASMA: Lactic Acid, Venous: 1.3 mmol/L (ref 0.5–2.2)

## 2012-01-27 LAB — HEMOGLOBIN AND HEMATOCRIT, BLOOD
HCT: 22.1 % — ABNORMAL LOW (ref 36.0–46.0)
Hemoglobin: 6.3 g/dL — CL (ref 12.0–15.0)

## 2012-01-27 LAB — CREATININE, SERUM
Creatinine, Ser: 0.9 mg/dL (ref 0.50–1.10)
GFR calc Af Amer: 86 mL/min — ABNORMAL LOW (ref >90)
GFR calc non Af Amer: 74 mL/min — ABNORMAL LOW (ref >90)

## 2012-01-27 MED ORDER — SODIUM CHLORIDE 0.9 % IV SOLN
INTRAVENOUS | Status: DC
  Filled 2012-01-27: qty 1

## 2012-01-27 MED ORDER — ONDANSETRON HCL 4 MG PO TABS: 4.0000 mg | ORAL_TABLET | Freq: Four times a day (QID) | ORAL | Status: DC | PRN

## 2012-01-27 MED ORDER — DEXTROSE 50 % IV SOLN: 25.0000 mL | INTRAVENOUS | Status: DC | PRN

## 2012-01-27 MED ORDER — ONDANSETRON HCL 4 MG/2ML IJ SOLN
4.0000 mg | Freq: Once | INTRAMUSCULAR | Status: AC
  Administered 2012-01-27: 4 mg via INTRAVENOUS
  Filled 2012-01-27: qty 2

## 2012-01-27 MED ORDER — SODIUM CHLORIDE 0.9 % IV SOLN
INTRAVENOUS | Status: DC
  Administered 2012-01-27: 4.9 [IU]/h via INTRAVENOUS
  Filled 2012-01-27: qty 1

## 2012-01-27 MED ORDER — LEVOFLOXACIN IN D5W 750 MG/150ML IV SOLN
750.0000 mg | INTRAVENOUS | Status: DC
  Administered 2012-01-28 – 2012-01-29 (×3): 750 mg via INTRAVENOUS
  Filled 2012-01-27 (×4): qty 150

## 2012-01-27 MED ORDER — FLUCONAZOLE 100MG IVPB
100.0000 mg | INTRAVENOUS | Status: AC
  Administered 2012-01-27 – 2012-01-28 (×2): 100 mg via INTRAVENOUS
  Filled 2012-01-27 (×2): qty 50

## 2012-01-27 MED ORDER — SODIUM CHLORIDE 0.9 % IV BOLUS (SEPSIS)
1000.0000 mL | Freq: Once | INTRAVENOUS | Status: AC
  Administered 2012-01-27: 1000 mL via INTRAVENOUS

## 2012-01-27 MED ORDER — MORPHINE SULFATE 2 MG/ML IJ SOLN
1.0000 mg | INTRAMUSCULAR | Status: DC | PRN
Start: 2012-01-27 — End: 2012-01-30
  Administered 2012-01-28: 1 mg via INTRAVENOUS
  Filled 2012-01-27: qty 1

## 2012-01-27 MED ORDER — INSULIN REGULAR BOLUS VIA INFUSION
0.0000 [IU] | Freq: Three times a day (TID) | INTRAVENOUS | Status: DC
  Filled 2012-01-27: qty 10

## 2012-01-27 MED ORDER — ONDANSETRON HCL 4 MG/2ML IJ SOLN: 4.0000 mg | Freq: Four times a day (QID) | INTRAMUSCULAR | Status: DC | PRN

## 2012-01-27 MED ORDER — LORATADINE 10 MG PO TABS
10.0000 mg | ORAL_TABLET | Freq: Every day | ORAL | Status: DC
  Administered 2012-01-28 – 2012-01-29 (×2): 10 mg via ORAL
  Filled 2012-01-27 (×3): qty 1

## 2012-01-27 MED ORDER — PANTOPRAZOLE SODIUM 40 MG IV SOLR
40.0000 mg | INTRAVENOUS | Status: DC
  Administered 2012-01-27: 40 mg via INTRAVENOUS
  Filled 2012-01-27 (×2): qty 40

## 2012-01-27 MED ORDER — PROMETHAZINE HCL 25 MG/ML IJ SOLN
12.5000 mg | Freq: Four times a day (QID) | INTRAMUSCULAR | Status: DC | PRN
  Administered 2012-01-28 (×2): 12.5 mg via INTRAVENOUS
  Filled 2012-01-27 (×2): qty 1

## 2012-01-27 MED ORDER — SODIUM CHLORIDE 0.9 % IV SOLN
INTRAVENOUS | Status: DC
  Administered 2012-01-27: 1000 mL via INTRAVENOUS
  Administered 2012-01-28: 23:00:00 via INTRAVENOUS
  Administered 2012-01-28 (×2): 150 mL/h via INTRAVENOUS
  Administered 2012-01-29 (×2): via INTRAVENOUS

## 2012-01-27 MED ORDER — DEXTROSE-NACL 5-0.45 % IV SOLN
INTRAVENOUS | Status: DC
  Administered 2012-01-28: 01:00:00 via INTRAVENOUS

## 2012-01-27 MED ORDER — MORPHINE SULFATE 4 MG/ML IJ SOLN
4.0000 mg | Freq: Once | INTRAMUSCULAR | Status: AC
  Administered 2012-01-27: 4 mg via INTRAVENOUS
  Filled 2012-01-27: qty 1

## 2012-01-27 MED ORDER — TRIAMCINOLONE ACETONIDE 0.5 % EX CREA
TOPICAL_CREAM | Freq: Two times a day (BID) | CUTANEOUS | Status: DC
  Administered 2012-01-28 – 2012-01-29 (×3): via TOPICAL
  Filled 2012-01-27: qty 15

## 2012-01-27 MED ORDER — BISACODYL 5 MG PO TBEC: 5.0000 mg | DELAYED_RELEASE_TABLET | Freq: Every day | ORAL | Status: DC | PRN

## 2012-01-27 MED ORDER — PROMETHAZINE HCL 25 MG/ML IJ SOLN
25.0000 mg | Freq: Once | INTRAMUSCULAR | Status: AC
  Administered 2012-01-27: 25 mg via INTRAVENOUS
  Filled 2012-01-27: qty 1

## 2012-01-27 NOTE — ED Provider Notes (Signed)
51 year old female has been vomiting for the last several days. She has noted streaks of blood in her emesis, but hemoglobin is found to have dropped to 7.8. She has been having dark bowel movements. She will need to be admitted for blood transfusion and probable GI consultation for endoscopy to identify the source of bleeding. She is diabetic but shows no evidence of ketoacidosis.  Dione Booze, MD 01/27/12 [phone]

## 2012-01-27 NOTE — ED Notes (Signed)
Pt took 10 units of novolog at 0900. Pt states her CBG was >600. Pt states that she has been vomiting blood x2 today. Pt states she's had it happen before. Pt states vomit is beige with dark red blood. Pt states "my stomach is burning."

## 2012-01-27 NOTE — ED Provider Notes (Signed)
Medical screening examination/treatment/procedure(s) were conducted as a shared visit with non-physician practitioner(s) and myself.  I personally evaluated the patient during the encounter   Dione Booze, MD 01/27/12 1642

## 2012-01-27 NOTE — ED Notes (Signed)
Pt vomited onto blanket. Emesis is a bright green with no signs of blood.  New blanket provided.

## 2012-01-27 NOTE — ED Notes (Signed)
Vomiting x 3 days and her  Sugar is up no one follows her for diabetes she states

## 2012-01-27 NOTE — Progress Notes (Signed)
ANTIBIOTIC CONSULT NOTE - INITIAL  Pharmacy Consult for Levaquin Indication: UTI/upper respiratory infection  No Known Allergies  Patient Measurements: Height: 5\' 1"  (154.9 cm) Weight: 118 lb 12.8 oz (53.887 kg) IBW/kg (Calculated) : 47.8   Vital Signs: Temp: 98.6 F (37 C) (06/10 1758) Temp src: Oral (06/10 1758) BP: 151/74 mmHg (06/10 1758) Pulse Rate: 103  (06/10 1758) Intake/Output from previous day:   Intake/Output from this shift:    Labs:  Basename 01/27/12 1610 01/27/12 1330  WBC -- 14.8*  HGB 7.6* 7.8*  PLT -- 299  LABCREA -- --  CREATININE -- 0.93   Estimated Creatinine Clearance: 55.2 ml/min (by C-G formula based on Cr of 0.93). No results found for this basename: VANCOTROUGH:2,VANCOPEAK:2,VANCORANDOM:2,GENTTROUGH:2,GENTPEAK:2,GENTRANDOM:2,TOBRATROUGH:2,TOBRAPEAK:2,TOBRARND:2,AMIKACINPEAK:2,AMIKACINTROU:2,AMIKACIN:2, in the last 72 hours   Microbiology: No results found for this or any previous visit (from the past 720 hour(s)).  Medical History: Past Medical History  Diagnosis Date  . DM type 1 causing eye disease   . Acid reflux   . High cholesterol   . Psoriasis   . Menorrhagia   . Anemia   . Hyperglycemia   . Allergic rhinitis   . Diabetic retinopathy associated with type 1 diabetes mellitus   . GERD (gastroesophageal reflux disease)   . Cachexia    Assessment: 51 yo F admitted with 3-4 days of progressive N/V; her blood sugars have been in the 500-600 range for the past several days.  Also noted small amount of blood when coughing up sputum, has history of anemia and has had multiple transfusions in the past (thought to be due to menorrhagia).  Has been having dysuria, to be started on Levaquin for UTI and upper respiratory infection.   Also Diflucan for yeast UTI.    Goal of Therapy:  resolve infection  Plan:  1.  Levaquin 750 mg IV q24hr 2.  F/up cultures, renal function   Rolland Porter, Pharm.D., BCPS Clinical Pharmacist Pager:  [phone] 01/27/2012,6:27 PM

## 2012-01-27 NOTE — ED Provider Notes (Signed)
History     CSN: [id number]  Arrival date & time 01/27/12  1223   First MD Initiated Contact with Patient 01/27/12 1515      Chief Complaint  Patient presents with  . Emesis    (Consider location/radiation/quality/duration/timing/severity/associated sxs/prior treatment) HPI  Patient presents to the ED with complaints of vomiting with hematemeses for 3 days. She states that she only has abdominal pain when she is vomiting and that her sugars have been in the 500 and 600's lately. She also endorses having a cough. She denies fevers. The patient has a history of acid reflux disease. She has a history of being anemic but her heme has never been below 9. The patient upon initial exam is not able to finish HPI due to vomiting. Will order phenergan and morphine. Last menstrual period 3 weeks ago and her flow is very heavy.   Past Medical History  Diagnosis Date  . Diabetes mellitus   . Acid reflux   . High cholesterol   . Psoriasis     No past surgical history on file.  No family history on file.  History  Substance Use Topics  . Smoking status: Never Smoker   . Smokeless tobacco: Not on file  . Alcohol Use: No    OB History    Grav Para Term Preterm Abortions TAB SAB Ect Mult Living                  Review of Systems   HEENT: denies blurry vision or change in hearing PULMONARY: Denies difficulty breathing and SOB CARDIAC: denies chest pain or heart palpitations MUSCULOSKELETAL:  denies being unable to ambulate ABDOMEN AL: denies diarrhea or diarrhea, or bloody diarrhea GU: denies loss of bowel or urinary control, vaginal discharge or urinary complaints NEURO: denies numbness and tingling in extremities SKIN: no new rashes PSYCH: patient behavior is normal NECK: No neck pain ENDOCRINE: Patient admits that her sugars have been elevated     Allergies  Review of patient's allergies indicates no known allergies.  Home Medications   Current Outpatient Rx  Name  Route Sig Dispense Refill  . LIPITOR PO Oral Take 1 tablet by mouth daily.    . INSULIN ASPART 100 UNIT/ML [place] SOLN Subcutaneous Inject 0-20 Units into the skin 3 (three) times daily before meals.    Marland Kitchen PANTOPRAZOLE SODIUM 40 MG PO TBEC Oral Take 40 mg by mouth daily.      BP 136/69  Pulse 97  Temp 98.7 F (37.1 C)  SpO2 100%  Physical Exam  Nursing note and vitals reviewed. Constitutional: She appears well-developed and well-nourished. No distress.       Pt actively vomiting   HENT:  Head: Normocephalic and atraumatic.  Eyes: Pupils are equal, round, and reactive to light.  Neck: Normal range of motion. Neck supple.  Cardiovascular: Normal rate and regular rhythm.   Pulmonary/Chest: Effort normal.  Abdominal: Soft. She exhibits no distension. There is tenderness (mild epigastric tenderness to palpation). There is no rebound and no guarding.  Neurological: She is alert.  Skin: Skin is warm and dry.    ED Course  Procedures (including critical care time)  Labs Reviewed  GLUCOSE, CAPILLARY - Abnormal; Notable for the following:    Glucose-Capillary 286 (*)    All other components within normal limits  CBC - Abnormal; Notable for the following:    WBC 14.8 (*)    Hemoglobin 7.8 (*)    HCT 27.8 (*)  MCV 58.9 (*)    MCH 16.5 (*)    MCHC 28.1 (*)    RDW 19.9 (*)    All other components within normal limits  DIFFERENTIAL - Abnormal; Notable for the following:    Neutrophils Relative 86 (*)    Lymphocytes Relative 7 (*)    Neutro Abs 12.8 (*)    All other components within normal limits  COMPREHENSIVE METABOLIC PANEL - Abnormal; Notable for the following:    Sodium 134 (*)    Chloride 94 (*)    Glucose, Bld 358 (*)    GFR calc non Af Amer 71 (*)    GFR calc Af Amer 82 (*)    All other components within normal limits  LIPASE, BLOOD - Abnormal; Notable for the following:    Lipase 8 (*)    All other components within normal limits  URINALYSIS, ROUTINE W REFLEX  MICROSCOPIC - Abnormal; Notable for the following:    Glucose, UA 500 (*)    Bilirubin Urine MODERATE (*)    Ketones, ur >80 (*)    Protein, ur 30 (*)    All other components within normal limits  URINE MICROSCOPIC-ADD ON - Abnormal; Notable for the following:    Squamous Epithelial / LPF FEW (*)    Bacteria, UA FEW (*)    Casts HYALINE CASTS (*)    All other components within normal limits  LACTIC ACID, PLASMA  HEMOGLOBIN AND HEMATOCRIT, BLOOD  TYPE AND SCREEN  SAMPLE TO BLOOD BANK   No results found.   1. Vomiting   2. Anemia requiring transfusions       MDM  3:31 PM- no severe tenderness. Patients labs show high WBC and HGB of 7.8. The patient has never had a heme this low before. Her glucose is elevated between 250-358 here in the ED.  4;18 PM-  Patient admitted to [facility]. Type and cross ordered, gluco stabilizer ordered. Pain medication and nausea. Pain medication given.  According to labs patient is not in DKA at this time.   CRITICAL CARE Performed by: Dorthula Matas   Total critical care time: 30 minutes  Critical care time was exclusive of separately billable procedures and treating other patients.  Critical care was necessary to treat or prevent imminent or life-threatening deterioration.  Critical care was time spent personally by me on the following activities: development of treatment plan with patient and/or surrogate as well as nursing, discussions with consultants, evaluation of patient's response to treatment, examination of patient, obtaining history from patient or surrogate, ordering and performing treatments and interventions, ordering and review of laboratory studies, ordering and review of radiographic studies, pulse oximetry and re-evaluation of patient's condition.   Team 2 Admitting physician Maryln Manuel Step Down            Dorthula Matas, [place] 01/27/12 1621

## 2012-01-27 NOTE — H&P (Addendum)
History and Physical Examination  Date: 01/27/2012  Patient name: Nancy Singh Medical record number: [id number] Date of birth: 05/03/1961 Age: 51 y.o. Gender: female PCP: pt denies having a PCP.  She says she has been going to ERs and urgent cares for medical care.  Formerly she was a Acupuncturist patient.   Chief Complaint:  Chief Complaint  Patient presents with  . Emesis    Historian:  Patient who was a poor historian.  She would not tell me the providers she has seen in the recent past or who prescribes the insulin she has been taking or who did the GI procedures she has had in the last few years.   History of Present Illness: Nancy Singh is an 51 y.o. female with type 1 diabetes mellitus, complicated by diabetic retinopathy, allergic rhinitis, psoriasis who has no current primary care physician or endocrinologist presented to the emergency department complaining of 3-4 days of progressive nausea and vomiting.  The patient reports that she has not been able to eat or drink in the last 24 hours.  She reports that she has noted that her blood sugars have been in the 500-600 range for the past several days.  The patient reports that she has been taking small doses of NovoLog insulin.  The patient reports she's experienced chills but no fever.  The patient reports mild abdominal pain with vomiting.  The patient reports that she also has noted coughing with greenish sputum production.  She reports chronic sinus drainage with a bad taste in the back of the mouth.  She also reports that she has menorrhagia and reports that she has had black stools.  She also reports noting small amounts of blood when coughing up sputum.  She has a history of anemia and has had multiple transfusions in the past.  Her anemia has been thought related to the menorrhagia.  She reports that she had a colonoscopy approximately 3 years ago.  She reports that it was normal.  She reports that she had an EGD 3 years ago  that was reportedly normal.  She does not remember which provider she saw who performed those procedures.  The patient also reports that she has been experiencing severe burning in the stomach related to chronic acid reflux.  She also reports increased urination for the past 3 days.  She reports that she has been urinating every 15 minutes.  The patient also reports that she has noticed yeast.  The patient was seen in the emergency department and found to have an initial blood glucose of 286.  However, the patient's blood glucose continued to rise to a current value of near 600.  The patient reports that she has labile type 1 diabetes mellitus and has noticed more blood glucose fluctuations during this current illness.  She did take 10 units of NovoLog prior to coming to the emergency department.  She was also found to have an anemia with a hemoglobin of 7.6.  The ER providers were concerned because the patient appeared ill and requested hospitalization for further evaluation and management.  Past Medical History Past Medical History  Diagnosis Date  . DM type 1 causing eye disease   . Acid reflux   . High cholesterol   . Psoriasis   . Menorrhagia   . Anemia   . Hyperglycemia   . Allergic rhinitis   . Diabetic retinopathy associated with type 1 diabetes mellitus   . GERD (gastroesophageal reflux disease)   .  Cachexia    Upon review of medical records it was noted that the patient has a history of alcohol abuse and tobacco abuse  Past Surgical History The patient reports that she has had prior colonoscopy and EGD.  This was approximately 3 years ago per her report.  Home Meds: Prior to Admission medications   Medication Sig Start Date End Date Taking? Authorizing Provider  Atorvastatin Calcium (LIPITOR PO) Take 1 tablet by mouth daily.   Yes Historical Provider, MD  insulin aspart (NOVOLOG) 100 UNIT/ML injection Inject 0-20 Units into the skin 3 (three) times daily before meals.   Yes  Historical Provider, MD  pantoprazole (PROTONIX) 40 MG tablet Take 40 mg by mouth daily.   Yes Historical Provider, MD    Allergies: Review of patient's allergies indicates no known allergies.  Social History:  History   Social History  . Marital Status: Single    Spouse Name: N/A    Number of Children: N/A  . Years of Education: N/A   Occupational History  . Not on file.   Social History Main Topics  . Smoking status: Never Smoker   . Smokeless tobacco: Not on file  . Alcohol Use: No  . Drug Use:   . Sexually Active:    Other Topics Concern  . Not on file   Social History Narrative  . No narrative on file   Family History: Noncontributory  Review of Systems: Pertinent items are noted in HPI. All other systems reviewed and reported as negative.   Physical Exam: Blood pressure 152/83, pulse 107, temperature 98.7 F (37.1 C), SpO2 100.00%. General appearance: alert, cooperative, appears older than stated age, cachectic, fatigued and moderate distress Head: Normocephalic, without obvious abnormality, atraumatic Eyes: Bilateral injected sclera Ears: normal TM's and external ear canals both ears Nose: Bilateral swollen nasal turbinates with yellow crusting noted Throat: Severe dry mucous membranes and lips, poor dentition with gingivitis and dental caries noted Neck: no adenopathy, no carotid bruit, no JVD, supple, symmetrical, trachea midline and thyroid not enlarged, symmetric, no tenderness/mass/nodules Lungs: clear to auscultation bilaterally and normal percussion bilaterally Heart: regular rate and rhythm, S1, S2 normal, no murmur, click, rub or gallop Abdomen: Soft, bowel sounds normal, no masses palpated, suprapubic tenderness present, nonspecific generalized tenderness to palpation, no guarding or rebound tenderness. Pelvic: Deferred Extremities: No edema noted, severe psoriatic lesions noted on both lower extremities Pulses: 2+ and symmetric Skin: Severe  psoriasis noted Neurologic: Grossly normal  Lab  And Imaging results:  Results for orders placed during the hospital encounter of 01/27/12 (from the past 24 hour(s))  GLUCOSE, CAPILLARY     Status: Abnormal   Collection Time   01/27/12 12:36 PM      Component Value Range   Glucose-Capillary 286 (*) 70 - 99 (mg/dL)  CBC     Status: Abnormal   Collection Time   01/27/12  1:30 PM      Component Value Range   WBC 14.8 (*) 4.0 - 10.5 (K/uL)   RBC 4.72  3.87 - 5.11 (MIL/uL)   Hemoglobin 7.8 (*) 12.0 - 15.0 (g/dL)   HCT 40.9 (*) 81.1 - 46.0 (%)   MCV 58.9 (*) 78.0 - 100.0 (fL)   MCH 16.5 (*) 26.0 - 34.0 (pg)   MCHC 28.1 (*) 30.0 - 36.0 (g/dL)   RDW 91.4 (*) 78.2 - 15.5 (%)   Platelets 299  150 - 400 (K/uL)  DIFFERENTIAL     Status: Abnormal   Collection Time  01/27/12  1:30 PM      Component Value Range   Neutrophils Relative 86 (*) 43 - 77 (%)   Lymphocytes Relative 7 (*) 12 - 46 (%)   Monocytes Relative 6  3 - 12 (%)   Eosinophils Relative 0  0 - 5 (%)   Basophils Relative 1  0 - 1 (%)   Neutro Abs 12.8 (*) 1.7 - 7.7 (K/uL)   Lymphs Abs 1.0  0.7 - 4.0 (K/uL)   Monocytes Absolute 0.9  0.1 - 1.0 (K/uL)   Eosinophils Absolute 0.0  0.0 - 0.7 (K/uL)   Basophils Absolute 0.1  0.0 - 0.1 (K/uL)   RBC Morphology POLYCHROMASIA PRESENT     Smear Review       Value: PLATELET CLUMPS NOTED ON SMEAR, COUNT APPEARS ADEQUATE  COMPREHENSIVE METABOLIC PANEL     Status: Abnormal   Collection Time   01/27/12  1:30 PM      Component Value Range   Sodium 134 (*) 135 - 145 (mEq/L)   Potassium 4.1  3.5 - 5.1 (mEq/L)   Chloride 94 (*) 96 - 112 (mEq/L)   CO2 21  19 - 32 (mEq/L)   Glucose, Bld 358 (*) 70 - 99 (mg/dL)   BUN 22  6 - 23 (mg/dL)   Creatinine, Ser 1.61  0.50 - 1.10 (mg/dL)   Calcium 9.4  8.4 - 09.6 (mg/dL)   Total Protein 8.2  6.0 - 8.3 (g/dL)   Albumin 3.7  3.5 - 5.2 (g/dL)   AST 17  0 - 37 (U/L)   ALT 10  0 - 35 (U/L)   Alkaline Phosphatase 100  39 - 117 (U/L)   Total Bilirubin 0.4   0.3 - 1.2 (mg/dL)   GFR calc non Af Amer 71 (*) >90 (mL/min)   GFR calc Af Amer 82 (*) >90 (mL/min)  LIPASE, BLOOD     Status: Abnormal   Collection Time   01/27/12  1:30 PM      Component Value Range   Lipase 8 (*) 11 - 59 (U/L)  URINALYSIS, ROUTINE W REFLEX MICROSCOPIC     Status: Abnormal   Collection Time   01/27/12  1:32 PM      Component Value Range   Color, Urine YELLOW  YELLOW    APPearance CLEAR  CLEAR    Specific Gravity, Urine 1.028  1.005 - 1.030    pH 5.5  5.0 - 8.0    Glucose, UA 500 (*) NEGATIVE (mg/dL)   Hgb urine dipstick NEGATIVE  NEGATIVE    Bilirubin Urine MODERATE (*) NEGATIVE    Ketones, ur >80 (*) NEGATIVE (mg/dL)   Protein, ur 30 (*) NEGATIVE (mg/dL)   Urobilinogen, UA 0.2  0.0 - 1.0 (mg/dL)   Nitrite NEGATIVE  NEGATIVE    Leukocytes, UA NEGATIVE  NEGATIVE   URINE MICROSCOPIC-ADD ON     Status: Abnormal   Collection Time   01/27/12  1:32 PM      Component Value Range   Squamous Epithelial / LPF FEW (*) RARE    WBC, UA 0-2  <3 (WBC/hpf)   Bacteria, UA FEW (*) RARE    Casts HYALINE CASTS (*) NEGATIVE    Urine-Other RARE YEAST    LACTIC ACID, PLASMA     Status: Normal   Collection Time   01/27/12  3:49 PM      Component Value Range   Lactic Acid, Venous 1.3  0.5 - 2.2 (mmol/L)  TYPE AND SCREEN  Status: Normal   Collection Time   01/27/12  4:05 PM      Component Value Range   ABO/RH(D) O POS     Antibody Screen NEG     Sample Expiration 01/30/2012    HEMOGLOBIN AND HEMATOCRIT, BLOOD     Status: Abnormal   Collection Time   01/27/12  4:10 PM      Component Value Range   Hemoglobin 7.6 (*) 12.0 - 15.0 (g/dL)   HCT 54.0 (*) 98.1 - 46.0 (%)  GLUCOSE, CAPILLARY     Status: Abnormal   Collection Time   01/27/12  4:50 PM      Component Value Range   Glucose-Capillary 517 (*) 70 - 99 (mg/dL)   Comment 1 Notify RN     Comment 2 Documented in Chart       Impression   *Acute blood loss anemia  DM, UNCOMPLICATED, TYPE I, UNCONTROLLED  ANXIETY  DEPRESSION  ABUSE, ALCOHOL, EPISODIC  HYPERTENSION, BENIGN ESSENTIAL  PSORIASIS  HX, PERSONAL, PAST NONCOMPLIANCE  Diabetic retinopathy associated with type 1 diabetes mellitus  Allergic rhinitis  Hyperglycemia  GERD (gastroesophageal reflux disease)  Ketoacidosis due to diabetes  Dysuria  UTI (lower urinary tract infection)  Vagina, candidiasis  Dehydration  Intractable nausea and vomiting  Acute bacterial sinusitis  Leukocytosis  Plan  The patient is going to be admitted to the step down unit for close monitoring.  The patient is significantly ill at this time.  She's going to be started on an IV insulin infusion via the Glucommander protocol.  The patient also has a significant leukocytosis and will be started on IV antibiotics.  She has urinary tract infection and upper respiratory infection with bacterial sinusitis.  She will also be given Diflucan for the yeast infection.  She will be hydrated aggressively.  She's not in full diabetic ketoacidosis however she does have ketones in her urine and her blood glucose continues to rise.  Therefore it suspecting that she is heading into diabetic ketoacidosis we don't start treating her immediately.  I've ordered that the emergency department start an IV insulin infusion right away.  The patient has severe psoriasis and will prescribe a steroid cream for symptomatic relief.  We'll also prescribe medications for her allergic rhinitis.  Will as for GI evaluation regarding her anemia and transfuse packed red blood cells.  Hemoccult of the stool is pending at this time.  The patient reports a history of melena.  I've asked for blood cultures and urine culture.  The patient's lactic acid is within normal limits I don't suspect sepsis at this time.  I've recommended IV pantoprazole be started for the severe acid reflux.  Please see orders and follow hospital course.    Critical care was time spent 50 mins in development of treatment plan with patient  as well as nursing, discussions with consultants, evaluation of patient's response to treatment, examination of patient, obtaining history from patient or surrogate, ordering and performing treatments and interventions, ordering and review of laboratory studies, ordering and review of radiographic studies, pulse oximetry and re-evaluation of patient's condition.   Standley Dakins MD [facility] [place], [place] [phone] 01/27/2012, 5:16 PM

## 2012-01-27 NOTE — ED Notes (Signed)
States vomit had blood in it

## 2012-01-28 ENCOUNTER — Inpatient Hospital Stay (HOSPITAL_COMMUNITY): Payer: Self-pay

## 2012-01-28 DIAGNOSIS — E11319 Type 2 diabetes mellitus with unspecified diabetic retinopathy without macular edema: Secondary | ICD-10-CM

## 2012-01-28 DIAGNOSIS — D649 Anemia, unspecified: Secondary | ICD-10-CM

## 2012-01-28 DIAGNOSIS — E109 Type 1 diabetes mellitus without complications: Secondary | ICD-10-CM

## 2012-01-28 DIAGNOSIS — R7989 Other specified abnormal findings of blood chemistry: Secondary | ICD-10-CM

## 2012-01-28 LAB — COMPREHENSIVE METABOLIC PANEL
Albumin: 2.9 g/dL — ABNORMAL LOW (ref 3.5–5.2)
Alkaline Phosphatase: 94 U/L (ref 39–117)
BUN: 18 mg/dL (ref 6–23)
Creatinine, Ser: 0.98 mg/dL (ref 0.50–1.10)
GFR calc Af Amer: 77 mL/min — ABNORMAL LOW (ref >90)
Glucose, Bld: 150 mg/dL — ABNORMAL HIGH (ref 70–99)
Potassium: 4 mEq/L (ref 3.5–5.1)
Total Protein: 6.2 g/dL (ref 6.0–8.3)

## 2012-01-28 LAB — GLUCOSE, CAPILLARY
Glucose-Capillary: 113 mg/dL — ABNORMAL HIGH (ref 70–99)
Glucose-Capillary: 116 mg/dL — ABNORMAL HIGH (ref 70–99)
Glucose-Capillary: 155 mg/dL — ABNORMAL HIGH (ref 70–99)
Glucose-Capillary: 193 mg/dL — ABNORMAL HIGH (ref 70–99)
Glucose-Capillary: 86 mg/dL (ref 70–99)

## 2012-01-28 LAB — IRON AND TIBC: Saturation Ratios: 3 % — ABNORMAL LOW (ref 20–55)

## 2012-01-28 LAB — CARDIAC PANEL(CRET KIN+CKTOT+MB+TROPI)
CK, MB: 2.7 ng/mL (ref 0.3–4.0)
Relative Index: 2.3 (ref 0.0–2.5)
Relative Index: 2.1 (ref 0.0–2.5)
Total CK: 115 U/L (ref 7–177)
Troponin I: <0.3 ng/mL (ref <0.30)

## 2012-01-28 LAB — CBC
HCT: 25.3 % — ABNORMAL LOW (ref 36.0–46.0)
MCV: 62.3 fL — ABNORMAL LOW (ref 78.0–100.0)
Platelets: 231 10*3/uL (ref 150–400)
RBC: 4.06 MIL/uL (ref 3.87–5.11)
RDW: 23.7 % — ABNORMAL HIGH (ref 11.5–15.5)
WBC: 13.7 10*3/uL — ABNORMAL HIGH (ref 4.0–10.5)

## 2012-01-28 LAB — HEMOGLOBIN A1C: Hgb A1c MFr Bld: 9.2 % — ABNORMAL HIGH (ref <5.7)

## 2012-01-28 LAB — TYPE AND SCREEN
Antibody Screen: NEGATIVE
Unit division: 0

## 2012-01-28 LAB — VITAMIN B12: Vitamin B-12: 829 pg/mL (ref 211–911)

## 2012-01-28 LAB — FOLATE: Folate: >20 ng/mL

## 2012-01-28 MED ORDER — WHITE PETROLATUM GEL
Status: AC
  Administered 2012-01-28: 07:00:00
  Filled 2012-01-28: qty 5

## 2012-01-28 MED ORDER — INSULIN ASPART 100 UNIT/ML ~~LOC~~ SOLN: 0.0000 [IU] | Freq: Every day | SUBCUTANEOUS | Status: DC

## 2012-01-28 MED ORDER — ALPRAZOLAM 0.25 MG PO TABS
0.2500 mg | ORAL_TABLET | Freq: Three times a day (TID) | ORAL | Status: DC | PRN
  Administered 2012-01-28: 0.25 mg via ORAL
  Filled 2012-01-28: qty 1

## 2012-01-28 MED ORDER — FERROUS SULFATE 325 (65 FE) MG PO TABS
325.0000 mg | ORAL_TABLET | Freq: Two times a day (BID) | ORAL | Status: DC
  Administered 2012-01-28: 325 mg via ORAL
  Filled 2012-01-28 (×6): qty 1

## 2012-01-28 MED ORDER — PANTOPRAZOLE SODIUM 40 MG IV SOLR
40.0000 mg | Freq: Two times a day (BID) | INTRAVENOUS | Status: DC
  Administered 2012-01-28 – 2012-01-29 (×3): 40 mg via INTRAVENOUS
  Filled 2012-01-28 (×5): qty 40

## 2012-01-28 MED ORDER — INSULIN GLARGINE 100 UNIT/ML ~~LOC~~ SOLN
10.0000 [IU] | Freq: Every day | SUBCUTANEOUS | Status: DC
  Administered 2012-01-28: 10 [IU] via SUBCUTANEOUS

## 2012-01-28 MED ORDER — INSULIN ASPART 100 UNIT/ML ~~LOC~~ SOLN
0.0000 [IU] | Freq: Three times a day (TID) | SUBCUTANEOUS | Status: DC
  Administered 2012-01-28: 3 [IU] via SUBCUTANEOUS
  Administered 2012-01-29: 8 [IU] via SUBCUTANEOUS
  Administered 2012-01-30: 15 [IU] via SUBCUTANEOUS

## 2012-01-28 MED ORDER — SODIUM CHLORIDE 0.9 % IV SOLN
Freq: Once | INTRAVENOUS | Status: DC
Start: 2012-01-28 — End: 2012-01-28

## 2012-01-28 MED ORDER — ZOLPIDEM TARTRATE 5 MG PO TABS
5.0000 mg | ORAL_TABLET | Freq: Every evening | ORAL | Status: DC | PRN
  Filled 2012-01-28: qty 1

## 2012-01-28 MED ORDER — BIOTENE DRY MOUTH MT LIQD
15.0000 mL | Freq: Two times a day (BID) | OROMUCOSAL | Status: DC
  Administered 2012-01-28 – 2012-01-29 (×2): 15 mL via OROMUCOSAL

## 2012-01-28 MED ORDER — OXYMETAZOLINE HCL 0.05 % NA SOLN
1.0000 | Freq: Two times a day (BID) | NASAL | Status: DC | PRN
  Filled 2012-01-28: qty 15

## 2012-01-28 MED ORDER — HYDROCOD POLST-CHLORPHEN POLST 10-8 MG/5ML PO LQCR
5.0000 mL | Freq: Two times a day (BID) | ORAL | Status: DC | PRN
  Administered 2012-01-28 – 2012-01-29 (×2): 5 mL via ORAL
  Filled 2012-01-28 (×2): qty 5

## 2012-01-28 NOTE — Progress Notes (Signed)
Pt refusing to wear SCDs, explained importance r/t blood clots, pt verbalized understanding.  Requesting something for "anxiety", unable to rate level and states she has not "ever taken anything in the past for anxiety".  Dr. Butler Denmark notified, order for xanax received.  Pt refusing to sign for consent for EGD at this time and states she will decide in the morning if she will sign.

## 2012-01-28 NOTE — Progress Notes (Signed)
OT Cancellation Note  Treatment cancelled today due to patient's refusal to participate X 2. Will re-attempt 01/29/12  Jaretzi Droz A OTR/L [phone] 01/28/2012, 2:12 PM

## 2012-01-28 NOTE — Progress Notes (Signed)
Pts. blood sugar  Has been running in the low 100's, pt. cont. to be sleepy but arousable and respond appropriately, K. SchorrNP was notified about the blood sugar and insulin drip and neuro status. With order to cont. Insulin drip and blood sugar every hour until reach the target goal. 0620 K. Schorr was notified this am of last blood sugar 124. With orders made to give lantus this am 10 units. Will follow up further order of insulin this am.

## 2012-01-28 NOTE — Consult Note (Signed)
[facility] Gastroenterology Consultation Note  Referring Provider: Dr. Butler Denmark [facility])  Reason for Consultation:  Black stool, hematemesis  HPI: Nancy Singh is a 51 y.o. female admitted for diabetic ketoacidosis.  She has noticed some epigastric and periumbilical discomfort for the past several days.  Also has noticed darker stool and intermittent hematemesis.  With treatment of her DKA, her abdominal pain and nausea + vomiting has improved. No obvious further bleeding.  Has had two prior endoscopies, in January 2011 and November 2011, for hematemesis, with both studies showing hiatal hernia and esophagitis.  Had colonoscopy in June 2008 showing diminutive polyp and diverticulosis.   Past Medical History  Diagnosis Date  . DM type 1 causing eye disease   . Acid reflux   . High cholesterol   . Psoriasis   . Menorrhagia   . Anemia   . Hyperglycemia   . Allergic rhinitis   . Diabetic retinopathy associated with type 1 diabetes mellitus   . GERD (gastroesophageal reflux disease)   . Cachexia     History reviewed. No pertinent past surgical history.  Prior to Admission medications   Medication Sig Start Date End Date Taking? Authorizing Provider  Atorvastatin Calcium (LIPITOR PO) Take 1 tablet by mouth daily.   Yes Historical Provider, MD  insulin aspart (NOVOLOG) 100 UNIT/ML injection Inject 0-20 Units into the skin 3 (three) times daily before meals.   Yes Historical Provider, MD  pantoprazole (PROTONIX) 40 MG tablet Take 40 mg by mouth daily.   Yes Historical Provider, MD    Current Facility-Administered Medications  Medication Dose Route Frequency Provider Last Rate Last Dose  . 0.9 %  sodium chloride infusion   Intravenous Continuous Clanford Cyndie Mull, MD 150 mL/hr at 01/27/12 2000    . antiseptic oral rinse (BIOTENE) solution 15 mL  15 mL Mouth Rinse BID Clanford L Johnson, MD      . bisacodyl (DULCOLAX) EC tablet 5 mg  5 mg Oral Daily PRN Clanford L Johnson, MD      . dextrose  50 % solution 25 mL  25 mL Intravenous PRN Clanford L Johnson, MD      . fluconazole (DIFLUCAN) IVPB 100 mg  100 mg Intravenous Q24H Clanford Cyndie Mull, MD   100 mg at 01/27/12 2047  . insulin aspart (novoLOG) injection 0-15 Units  0-15 Units Subcutaneous TID WC Calvert Cantor, MD   3 Units at 01/28/12 0903  . insulin aspart (novoLOG) injection 0-5 Units  0-5 Units Subcutaneous QHS Saima Rizwan, MD      . insulin glargine (LANTUS) injection 10 Units  10 Units Subcutaneous Daily Roma Kayser Schorr, NP   10 Units at 01/28/12 [phone]  . levofloxacin (LEVAQUIN) IVPB 750 mg  750 mg Intravenous Q24H Elonda Husky, PHARMD   750 mg at 01/28/12 0105  . loratadine (CLARITIN) tablet 10 mg  10 mg Oral Daily Clanford Cyndie Mull, MD   10 mg at 01/28/12 0914  . morphine 2 MG/ML injection 1 mg  1 mg Intravenous Q4H PRN Clanford L Johnson, MD      . morphine 4 MG/ML injection 4 mg  4 mg Intravenous Once Dorthula Matas, PA   4 mg at 01/27/12 1638  . ondansetron (ZOFRAN) injection 4 mg  4 mg Intravenous Once Dione Booze, MD   4 mg at 01/27/12 1432  . ondansetron (ZOFRAN) tablet 4 mg  4 mg Oral Q6H PRN Clanford Cyndie Mull, MD       Or  .  ondansetron (ZOFRAN) injection 4 mg  4 mg Intravenous Q6H PRN Clanford L Johnson, MD      . pantoprazole (PROTONIX) injection 40 mg  40 mg Intravenous Q24H Clanford Cyndie Mull, MD   40 mg at 01/27/12 2047  . promethazine (PHENERGAN) injection 12.5 mg  12.5 mg Intravenous Q6H PRN Roma Kayser Schorr, NP   12.5 mg at 01/28/12 0905  . promethazine (PHENERGAN) injection 25 mg  25 mg Intravenous Once Dorthula Matas, PA   25 mg at 01/27/12 1628  . sodium chloride 0.9 % bolus 1,000 mL  1,000 mL Intravenous Once Dione Booze, MD   1,000 mL at 01/27/12 1718  . triamcinolone cream (KENALOG) 0.5 %   Topical BID Clanford L Johnson, MD      . white petrolatum (VASELINE) gel           . DISCONTD: dextrose 5 %-0.45 % sodium chloride infusion   Intravenous Continuous Clanford Cyndie Mull, MD      .  DISCONTD: insulin regular (NOVOLIN R,HUMULIN R) 1 Units/mL in sodium chloride 0.9 % 100 mL infusion   Intravenous Continuous Dorthula Matas, PA 4.9 mL/hr at 01/27/12 1928 4.9 Units/hr at 01/27/12 1928  . DISCONTD: insulin regular (NOVOLIN R,HUMULIN R) 1 Units/mL in sodium chloride 0.9 % 100 mL infusion   Intravenous Continuous Clanford L Johnson, MD 0.7 mL/hr at 01/28/12 0508 0.7 Units/hr at 01/28/12 0508  . DISCONTD: insulin regular bolus via infusion 0-10 Units  0-10 Units Intravenous TID WC Clanford Cyndie Mull, MD        Allergies as of 01/27/2012  . (No Known Allergies)    No family history on file.  History   Social History  . Marital Status: Single    Spouse Name: N/A    Number of Children: N/A  . Years of Education: N/A   Occupational History  . Not on file.   Social History Main Topics  . Smoking status: Never Smoker   . Smokeless tobacco: Not on file  . Alcohol Use: No  . Drug Use: No  . Sexually Active: No   Other Topics Concern  . Not on file   Social History Narrative  . No narrative on file    Review of Systems: Positive = bold Gen: Denies any fever, chills, rigors, night sweats, anorexia, fatigue, weakness, malaise, involuntary weight loss, and sleep disorder CV: Denies chest pain, angina, palpitations, syncope, orthopnea, PND, peripheral edema, and claudication. Resp: Denies dyspnea, cough, sputum, wheezing, coughing up blood. GI: Described in detail in HPI.    GU : Denies urinary burning, blood in urine, menorrhagia, urinary frequency, urinary hesitancy, nocturnal urination, and urinary incontinence. MS: Denies joint pain or swelling.  Denies muscle weakness, cramps, atrophy.  Derm: Denies rash, itching, oral ulcerations, hives, unhealing ulcers.  Psych: Denies depression, anxiety, memory loss, suicidal ideation, hallucinations,  and confusion. Heme: Denies bruising, bleeding, and enlarged lymph nodes. Neuro:  Denies any headaches, dizziness,  paresthesias. Endo:  Denies any problems with DM, thyroid, adrenal function.  Physical Exam: Vital signs in last 24 hours: Temp:  [97.1 F (36.2 C)-98.7 F (37.1 C)] 97.5 F (36.4 C) (06/11 0722) Pulse Rate:  [86-107] 86  (06/11 0722) Resp:  [16-24] 17  (06/11 0722) BP: (104-152)/(49-83) 128/63 mmHg (06/11 0722) SpO2:  [96 %-100 %] 97 % (06/11 0722) Weight:  [53.887 kg (118 lb 12.8 oz)] 53.887 kg (118 lb 12.8 oz) (06/10 1758) Last BM Date: 01/26/12 General: Somnolent, but arousable; chronically ill-appearing but is  in no acute distress Head:  Normocephalic and atraumatic. Eyes:  Sclera clear, no icterus.   Conjunctiva pink. Ears:  Normal auditory acuity. Nose:  No deformity, discharge,  or lesions. Mouth:  No deformity or lesions.  Fair dentition.  Oropharynx somewhat dry. Neck:  Supple; no masses or thyromegaly. Lungs:  Clear throughout to auscultation.   No wheezes, crackles, or rhonchi. No acute distress. Heart:  Regular rate and rhythm; no murmurs, clicks, rubs,  or gallops. Abdomen:  Soft,very mild epigastric tenderness. No masses, hepatosplenomegaly or hernias noted. Normal bowel sounds, without guarding, and without rebound.     Msk:  Symmetrical without gross deformities. Normal posture. Pulses:  Normal pulses noted. Extremities:  Without clubbing or edema. Neurologic:  Diffusely weak; otherwise alert and  oriented x4;  grossly normal neurologically. Skin:  Intact without significant lesions or rashes. Psych:  Somnolent but arousable; Alert and cooperative. Normal mood and affect.   Lab Results:  Basename 01/28/12 0220 01/27/12 2044 01/27/12 1909 01/27/12 1330  WBC 13.7* -- 15.6* 14.8*  HGB 7.6* 6.3* 7.8* --  HCT 25.3* 22.1* 28.5* --  PLT 231 -- 291 299   BMET  Basename 01/28/12 0220 01/27/12 2202 01/27/12 2044  NA 139 136 135  K 4.0 4.0 3.9  CL 106 101 99  CO2 21 19 14*  GLUCOSE 150* 279* 421*  BUN 18 20 20   CREATININE 0.98 0.97 0.94  CALCIUM 7.9* 8.0* 7.8*    LFT  Basename 01/28/12 0220  PROT 6.2  ALBUMIN 2.9*  AST 16  ALT 7  ALKPHOS 94  BILITOT 1.5*  BILIDIR --  IBILI --   PT/INR No results found for this basename: LABPROT:2,INR:2 in the last 72 hours  Studies/Results: Dg Chest Port 1 View  01/28/2012  *RADIOLOGY REPORT*  Clinical Data: Cough and chest congestion  PORTABLE CHEST - 1 VIEW  Comparison: 06/28/2010  Findings: Examination quality is suboptimal due to use of portable technique.  New patchy right lower lobe ill-defined airspace opacity is noted.  Heart size upper limits of normal.  No pleural effusion.  No acute osseous finding.  IMPRESSION: New patchy right lower lobe airspace opacity which could represent pneumonia given the clinical history.  Follow-up PA and lateral chest radiographs are recommended for better evaluation when the patient is clinically able.  Original Report Authenticated By: Harrel Lemon, M.D.   Dg Abd Portable 1v  01/27/2012  *RADIOLOGY REPORT*  Clinical Data: Abdominal pain.  PORTABLE ABDOMEN - 1 VIEW  Comparison: 12/13/2010  Findings: There is a small amount of air scattered throughout normal-appearing bowel.  No visible free air or free fluid.  No excessive stool.  Phlebolith in the left side of the pelvis.  No osseous abnormality.  IMPRESSION: Benign-appearing abdomen.  Original Report Authenticated By: Gwynn Burly, M.D.    Impression:  1.  Nausea, vomiting.  Suspect due to diabetic ketoacidosis.  Patient probably has underlying component of gastroparesis with secondary GERD as well. 2.  Hematemesis.  No further hematemesis since admission.  EGD x 2 for similar presentation.  Suspect esophagitis.  3.  Dark stool.  I am uncertain whether this in fact represents melena. 4.  Anemia.  Seemingly chronic, but might have acute component at this time as well.  Plan:  1.  Intensive PPI therapy. 2.  Clear liquids only today. 3.  Continue treatment for DKA. 4.  Serial CBCs and continued supportive  care. 5.  Endoscopy tomorrow; suspect this will show esophagitis again, but Mallory-Weiss  tear, ulcer, etc can't be ruled out.  No persistent bleeding at this time. 6.  Will follow.  Thank you for the consult.   LOS: 1 day   Raylan Troiani M  01/28/2012, 10:14 AM

## 2012-01-28 NOTE — Progress Notes (Addendum)
Pt asking to see RN.   Refusing to allow further fingersticks for blood glucose at this time.  Explained importance of checking blood glucose and pt continues to refuse at this time.  Dr. Butler Denmark paged.  Awaiting call back.  Dr. Butler Denmark will be here to see pt.  Order for diabetic diet and sliding scale insulin received.

## 2012-01-28 NOTE — Progress Notes (Signed)
[facility]  Interim history: Nancy Singh is an 51 y.o. female with type 1 diabetes mellitus, complicated by diabetic retinopathy, allergic rhinitis, psoriasis who has no current primary care physician or endocrinologist presented to the emergency department complaining of 3-4 days of progressive nausea and vomiting. The patient reports that she has not been able to eat or drink in the last 24 hours. She reports that she has noted that her blood sugars have been in the 500-600 range for the past several days. The patient reports that she has been taking small doses of NovoLog insulin. She has a history of anemia and has had multiple transfusions in the past. Her anemia has been thought related to the menorrhagia. She reports that she had a colonoscopy approximately 3 years ago. She reports that it was normal. She reports that she had an EGD 3 years ago that was reportedly normal. She does not remember which provider she saw who performed those procedures.   Consultants: Dr Dulce Sellar  Subjective: Nose is blocked, head heavy, yellow discharge from nose. No further vomiting - started eating. Dr Dulce Sellar is recommending an EGD.   Objective: Blood pressure 135/75, pulse 95, temperature 98.2 F (36.8 C), temperature source Oral, resp. rate 20, height 5\' 1"  (1.549 m), weight 53.887 kg (118 lb 12.8 oz), last menstrual period 12/23/2011, SpO2 99.00%. Weight change:   Intake/Output Summary (Last 24 hours) at 01/28/12 1749 Last data filed at 01/28/12 1700  Gross per 24 hour  Intake   2537 ml  Output    950 ml  Net   1587 ml    Physical Exam: General appearance: alert, cooperative and no distress Throat: lips, mucosa, and tongue normal; teeth and gums normal Lungs: clear to auscultation bilaterally Heart: regular rate and rhythm Abdomen: soft, non-tender; bowel sounds normal; no masses,  no organomegaly Extremities: extremities normal, atraumatic, no cyanosis or edema  Lab  Results:  [facility] 01/28/12 0220 01/27/12 2202 01/27/12 1909  NA 139 136 --  K 4.0 4.0 --  CL 106 101 --  CO2 21 19 --  GLUCOSE 150* 279* --  BUN 18 20 --  CREATININE 0.98 0.97 --  CALCIUM 7.9* 8.0* --  MG -- -- 1.6  PHOS -- -- 4.3    Basename 01/28/12 0220 01/27/12 1330  AST 16 17  ALT 7 10  ALKPHOS 94 100  BILITOT 1.5* 0.4  PROT 6.2 8.2  ALBUMIN 2.9* 3.7    Basename 01/27/12 1330  LIPASE 8*  AMYLASE --    Basename 01/28/12 0220 01/27/12 2044 01/27/12 1909 01/27/12 1330  WBC 13.7* -- 15.6* --  NEUTROABS -- -- -- 12.8*  HGB 7.6* 6.3* -- --  HCT 25.3* 22.1* -- --  MCV 62.3* -- 59.3* --  PLT 231 -- 291 --    Basename 01/28/12 0953 01/28/12 0220 01/27/12 1908  CKTOTAL 118 115 162  CKMB 2.5 2.7 4.3*  CKMBINDEX -- -- --  TROPONINI <0.30 <0.30 <0.30   No components found with this basename: POCBNP:3 No results found for this basename: DDIMER:2 in the last 72 hours  Basename 01/27/12 1909  HGBA1C 9.2*   No results found for this basename: CHOL:2,HDL:2,LDLCALC:2,TRIG:2,CHOLHDL:2,LDLDIRECT:2 in the last 72 hours  Basename 01/27/12 1909  TSH 1.845  T4TOTAL --  T3FREE --  THYROIDAB --    Basename 01/27/12 1909  VITAMINB12 829  FOLATE >20.0  FERRITIN 7*  TIBC 496*  IRON 16*  RETICCTPCT 2.6    Micro Results: Recent Results (from the past  240 hour(s))  MRSA PCR SCREENING     Status: Normal   Collection Time   01/27/12  5:48 PM      Component Value Range Status Comment   MRSA by PCR NEGATIVE  NEGATIVE  Final     Studies/Results: Dg Chest Port 1 View  01/28/2012  *RADIOLOGY REPORT*  Clinical Data: Cough and chest congestion  PORTABLE CHEST - 1 VIEW  Comparison: 06/28/2010  Findings: Examination quality is suboptimal due to use of portable technique.  New patchy right lower lobe ill-defined airspace opacity is noted.  Heart size upper limits of normal.  No pleural effusion.  No acute osseous finding.  IMPRESSION: New patchy right lower lobe airspace  opacity which could represent pneumonia given the clinical history.  Follow-up PA and lateral chest radiographs are recommended for better evaluation when the patient is clinically able.  Original Report Authenticated By: Harrel Lemon, M.D.   Dg Abd Portable 1v  01/27/2012  *RADIOLOGY REPORT*  Clinical Data: Abdominal pain.  PORTABLE ABDOMEN - 1 VIEW  Comparison: 12/13/2010  Findings: There is a small amount of air scattered throughout normal-appearing bowel.  No visible free air or free fluid.  No excessive stool.  Phlebolith in the left side of the pelvis.  No osseous abnormality.  IMPRESSION: Benign-appearing abdomen.  Original Report Authenticated By: Gwynn Burly, M.D.    Medications: Scheduled Meds:   . antiseptic oral rinse  15 mL Mouth Rinse BID  . ferrous sulfate  325 mg Oral BID WC  . fluconazole (DIFLUCAN) IV  100 mg Intravenous Q24H  . insulin aspart  0-15 Units Subcutaneous TID WC  . insulin aspart  0-5 Units Subcutaneous QHS  . insulin glargine  10 Units Subcutaneous Daily  . levofloxacin (LEVAQUIN) IV  750 mg Intravenous Q24H  . loratadine  10 mg Oral Daily  . pantoprazole (PROTONIX) IV  40 mg Intravenous Q12H  . triamcinolone cream   Topical BID  . white petrolatum      . DISCONTD: sodium chloride   Intravenous Once  . DISCONTD: insulin regular  0-10 Units Intravenous TID WC  . DISCONTD: pantoprazole (PROTONIX) IV  40 mg Intravenous Q24H   Continuous Infusions:   . sodium chloride 150 mL/hr (01/28/12 1511)  . DISCONTD: dextrose 5 % and 0.45% NaCl Stopped (01/28/12 0800)  . DISCONTD: insulin (NOVOLIN-R) infusion 4.9 Units/hr (01/27/12 1928)  . DISCONTD: insulin (NOVOLIN-R) infusion 0.7 Units/hr (01/28/12 0508)   PRN Meds:.ALPRAZolam, bisacodyl, dextrose, morphine injection, ondansetron (ZOFRAN) IV, ondansetron, promethazine  Assessment/Plan:   Blood loss anemia Received one unit PRBC without much increase in Hgb. Will repeat CBC and see if she needs further  PRBCs. I have recommended this but she is declining. Anyhow, will repeat CBC tomorrow.   Iron deficiency Have recommended IV Iron after a test dose. She has declined but is agreeble to PO iron.   Hyperglycemia/DM, TYPE I, UNCONTROLLED May be related to acute sinusitis? Vomiting started after the hyperglycemia, Suspect early DKA. Pt was refusing Q1 hr finger sticks and was demanding to eat this AM and therefore a sliding scale and diet were ordered. Sugars remaining stable.  Cont lantus and sliding scale for now- PO intake is poor.   H/o Gi bleed EGD per Dr Dulce Sellar- f/u occult blood   ABUSE, ALCOHOL, EPISODIC   HYPERTENSION, BENIGN ESSENTIAL Follow BP   PSORIASIS   HX, PERSONAL, PAST NONCOMPLIANCE currently no PCP   Vagina, candidiasis Diflucan   Acute bacterial sinusitis Levaquin, PRN Afrin  Code Status: Full code Disposition: transfer to [facility] [phone] 01/28/2012, 5:49 PM  LOS: 1 day

## 2012-01-28 NOTE — Evaluation (Signed)
Physical Therapy Evaluation Patient Details Name: Nancy Singh MRN: [id number] DOB: 1960/12/24 Today's Date: 01/28/2012 Time: 4098-1191 PT Time Calculation (min): 11 min  PT Assessment / Plan / Recommendation Clinical Impression  PAtient s/p abdominal pain with good mobility overall.  Does not need PT or OT services at this time.  Will not follow.  No recommendations for f/u either.      PT Assessment  Patent does not need any further PT services    Follow Up Recommendations  No PT follow up    Barriers to Discharge  None      lEquipment Recommendations  None recommended by PT    Recommendations for Other Services   None  Frequency   N.A   Precautions / Restrictions Precautions Precautions: None Restrictions Weight Bearing Restrictions: No   Pertinent Vitals/Pain VSS, No pain      Mobility  Bed Mobility Bed Mobility: Rolling Left;Left Sidelying to Sit Rolling Left: 7: Independent Left Sidelying to Sit: 7: Independent Transfers Transfers: Sit to Stand;Stand to Sit Sit to Stand: 7: Independent Stand to Sit: 7: Independent Ambulation/Gait Ambulation/Gait Assistance: 7: Independent Ambulation Distance (Feet): 40 Feet Assistive device: None Gait Pattern: Within Functional Limits Stairs: No Wheelchair Mobility Wheelchair Mobility: No    PT Goals  N/A  Visit Information  Last PT Received On: 01/28/12 Assistance Needed: +1    Subjective Data  Subjective: "I feel so tired of being here.  What do I need to do for you?" Patient Stated Goal: To go home   Prior Functioning  Home Living Lives With:  (Someone lives with me per pt) Available Help at Discharge: Family;Available PRN/intermittently Type of Home: House Home Access: Stairs to enter [employer] of Steps: few Entrance Stairs-Rails: None Home Layout: One level Firefighter: Standard Home Adaptive Equipment: None Prior Function Level of Independence: Independent Able to Take  Stairs?: Yes Driving: Yes Comments: Patient did not want to answer some of the questions. Communication Communication: No difficulties    Cognition  Overall Cognitive Status: Appears within functional limits for tasks assessed/performed Arousal/Alertness: Awake/alert Orientation Level: Appears intact for tasks assessed Behavior During Session: [facility] for tasks performed    Extremity/Trunk Assessment Right Upper Extremity Assessment RUE ROM/Strength/Tone: Within functional levels RUE Sensation: WFL - Light Touch RUE Coordination: WFL - gross/fine motor Left Upper Extremity Assessment LUE ROM/Strength/Tone: Within functional levels LUE Sensation: WFL - Light Touch LUE Coordination: WFL - gross/fine motor Right Lower Extremity Assessment RLE ROM/Strength/Tone: Within functional levels RLE Sensation: WFL - Light Touch RLE Coordination: WFL - gross/fine motor Left Lower Extremity Assessment LLE ROM/Strength/Tone: Within functional levels LLE Sensation: WFL - Light Touch LLE Coordination: WFL - gross/fine motor Trunk Assessment Trunk Assessment: Normal   Balance Balance Balance Assessed: Yes Dynamic Standing Balance Dynamic Standing - Balance Support: No upper extremity supported;During functional activity Dynamic Standing - Level of Assistance: 7: Independent Dynamic Standing - Balance Activities: Lateral lean/weight shifting;Forward lean/weight shifting;Reaching for weighted objects;Reaching across midline  End of Session PT - End of Session Equipment Utilized During Treatment: Gait belt Activity Tolerance: Patient tolerated treatment well Patient left: in bed;with call bell/phone within reach Nurse Communication: Mobility status   INGOLD,Brian Kocourek 01/28/2012, 4:28 PM  [facility] [phone] (pager)

## 2012-01-28 NOTE — Progress Notes (Signed)
Pts. Hgb. was 6.3- K. SchorrNP made aware, pt. Has 2 new PIV restarted by IV team. blood transfusion started. Will cont. to monitor.

## 2012-01-28 NOTE — Progress Notes (Signed)
Spoke with patient briefly.  At first she states that she is on Lantus/Novolog regimen at home.  I then asked her about who her PCP is.  She states that she does not have and dr.  I asked who fills her prescriptions and she stated no one.  Patient's affect is very flat.  She states "I have been doing this for 30 years, it is nothing new".  She states [facility] and the clinics [facility] at [facility].  She admits that she buys her insulin OTC, which is likely NPH and Regular.   Will order case management consult for PCP and Rx. Assistance.  Will discuss with RN.

## 2012-01-29 ENCOUNTER — Encounter (HOSPITAL_COMMUNITY)
Admission: EM | Disposition: A | Discharge status: Home / Self Care | Payer: Self-pay | Source: Ambulatory Visit | Attending: Internal Medicine

## 2012-01-29 DIAGNOSIS — R7989 Other specified abnormal findings of blood chemistry: Secondary | ICD-10-CM

## 2012-01-29 DIAGNOSIS — D649 Anemia, unspecified: Secondary | ICD-10-CM

## 2012-01-29 DIAGNOSIS — E109 Type 1 diabetes mellitus without complications: Secondary | ICD-10-CM

## 2012-01-29 DIAGNOSIS — J019 Acute sinusitis, unspecified: Secondary | ICD-10-CM

## 2012-01-29 LAB — GLUCOSE, CAPILLARY
Glucose-Capillary: 90 mg/dL (ref 70–99)
Glucose-Capillary: 215 mg/dL — ABNORMAL HIGH (ref 70–99)
Glucose-Capillary: 148 mg/dL — ABNORMAL HIGH (ref 70–99)

## 2012-01-29 LAB — BASIC METABOLIC PANEL
BUN: 10 mg/dL (ref 6–23)
Chloride: 107 mEq/L (ref 96–112)
GFR calc Af Amer: 83 mL/min — ABNORMAL LOW (ref >90)
GFR calc non Af Amer: 72 mL/min — ABNORMAL LOW (ref >90)
Glucose, Bld: 182 mg/dL — ABNORMAL HIGH (ref 70–99)
Potassium: 3.5 mEq/L (ref 3.5–5.1)
Sodium: 137 mEq/L (ref 135–145)

## 2012-01-29 LAB — URINE CULTURE
Colony Count: 35000
Culture  Setup Time: 201306111647

## 2012-01-29 LAB — CBC
HCT: 24.7 % — ABNORMAL LOW (ref 36.0–46.0)
Hemoglobin: 7.1 g/dL — ABNORMAL LOW (ref 12.0–15.0)
RDW: 23 % — ABNORMAL HIGH (ref 11.5–15.5)
WBC: 11.1 10*3/uL — ABNORMAL HIGH (ref 4.0–10.5)

## 2012-01-29 SURGERY — EGD (ESOPHAGOGASTRODUODENOSCOPY)
Anesthesia: Moderate Sedation

## 2012-01-29 NOTE — Progress Notes (Signed)
Pt being very difficulty t/o the day.  Refusing to take her Lantus and Novolog.  Pt also refusing to take her Iron tablet stating that it makes her nauseated.  Pt also refusing to do an EGD after explaining the benefits of the testing.  Pt was going to leave AMA but refused to sign paperwork.  Pt now agrees to stay for the night to received another dose of IV antibiotics and has for the first time agreed to take her insulin after it was brought to her attention that her blood sugars were slowing going up.

## 2012-01-29 NOTE — Progress Notes (Signed)
The patient was scheduled for an EGD today to evaluate her vomiting, hematemesis, and melena. She is feeling better today and refuses EGD. She just wishes to be treated medically. Despite discussion with her about this she refuses EGD. I would recommend continuing medical therapy. Call us again if we can be of any help.

## 2012-01-29 NOTE — Progress Notes (Signed)
OT Discharge Note:  Pt presents at baseline functioning and with no acute OT needs indicated at this time. Will sign off. Please re-consult as needed. Thank you. Glendale Chard, OTR/L Pager: [phone] 01/29/2012

## 2012-01-29 NOTE — Progress Notes (Signed)
Subjective: Nancy Singh has a very flat affect. She is extremely hypersensitive to the fact that an EGD is recommended and she feels that it is not warranted as she has had EGD's in the past  Which were non-diagnostic per her reports. She states that she only has emesis when her blood sugars are markedly increased, and she can usually tolerate diet once she has allowed a few days for her stomach to calm down. Nancy Singh feels that she is being badgered to accept the EGD. I have explained that as long as she is comfortable with her decision, then we are accepting of this. However this may limit the information needed to make medical decisions. Nancy Singh also states that her hemoglobin has been at 7 for several years. She was supposed to take oral Iron but stopped due to stomach upset. She is also nebulous on her Insulin regimen at home. In further discussion she admits that she did not like the services at [facility], but has no other options for care. She also admitted that she likely changed her insulin regime as she could get Novolin and Novolog OTC. She reports that she manages her diabetes.  I asked the patient to articulate how we could be of help to her in light of the fact that she was refusing the care offered. She stated that she would consider the IV iron, and that we would advance her diet as tolerated. She would continue on antibiotics and convert all necessary medications to oral route once she was tolerating diet.  Nurses report that patient is essentially refusing all of her medications including Lantus, except for Tussionex. They also report that the patient has been uncooperative with almost all of her care.  Objective: Filed Vitals:   01/28/12 1944 01/28/12 2120 01/28/12 2345 01/29/12 0547  BP: 119/60 122/77  115/73  Pulse: 103 87  83  Temp: 98.2 F (36.8 C) 98.3 F (36.8 C)  98 F (36.7 C)  TempSrc: Oral Oral  Oral  Resp: 25 20 18 18   Height:      Weight:      SpO2: 98% 96% 97% 91%    Weight change:   Intake/Output Summary (Last 24 hours) at 01/29/12 1040 Last data filed at 01/29/12 0700  Gross per 24 hour  Intake   1610 ml  Output    850 ml  Net    760 ml    General: Alert, awake, oriented x3, in no acute distress. Has a very flat affect. HEENT: [place]/AT PEERL, EOMI Neck: Trachea midline,  no masses, no thyromegal,y no JVD, no carotid bruit OROPHARYNX:  Moist, No exudate/ erythema/lesions.  Heart: Regular rate and rhythm, without murmurs, rubs, gallops, PMI non-displaced, no heaves or thrills on palpation.  Lungs: Clear to auscultation, no wheezing or rhonchi noted. No increased vocal fremitus resonant to percussion  Abdomen: Soft, nontender, nondistended, positive bowel sounds, no masses no hepatosplenomegaly noted..  Neuro: No focal neurological deficits noted cranial nerves II through XII grossly intact. DTRs 2+ bilaterally upper and lower extremities. Strength functional in bilateral upper and lower extremities. Musculoskeletal: No warm swelling or erythema around joints, no spinal tenderness noted. Psychiatric: Patient alert and oriented x3. Has a flat affect.   Lab Results:  Basename 01/29/12 0500 01/28/12 0220 01/27/12 1909  NA 137 139 --  K 3.5 4.0 --  CL 107 106 --  CO2 20 21 --  GLUCOSE 182* 150* --  BUN 10 18 --  CREATININE 0.92 0.98 --  CALCIUM  7.1* 7.9* --  MG -- -- 1.6  PHOS -- -- 4.3    Basename 01/28/12 0220 01/27/12 1330  AST 16 17  ALT 7 10  ALKPHOS 94 100  BILITOT 1.5* 0.4  PROT 6.2 8.2  ALBUMIN 2.9* 3.7    Basename 01/27/12 1330  LIPASE 8*  AMYLASE --    Basename 01/29/12 0500 01/28/12 0220 01/27/12 1330  WBC 11.1* 13.7* --  NEUTROABS -- -- 12.8*  HGB 7.1* 7.6* --  HCT 24.7* 25.3* --  MCV 63.3* 62.3* --  PLT 211 231 --    Basename 01/28/12 0953 01/28/12 0220 01/27/12 1908  CKTOTAL 118 115 162  CKMB 2.5 2.7 4.3*  CKMBINDEX -- -- --  TROPONINI <0.30 <0.30 <0.30   No components found with this basename:  POCBNP:3 No results found for this basename: DDIMER:2 in the last 72 hours  Basename 01/27/12 1909  HGBA1C 9.2*   No results found for this basename: CHOL:2,HDL:2,LDLCALC:2,TRIG:2,CHOLHDL:2,LDLDIRECT:2 in the last 72 hours  Basename 01/27/12 1909  TSH 1.845  T4TOTAL --  T3FREE --  THYROIDAB --    Basename 01/27/12 1909  VITAMINB12 829  FOLATE >20.0  FERRITIN 7*  TIBC 496*  IRON 16*  RETICCTPCT 2.6    Micro Results: Recent Results (from the past 240 hour(s))  MRSA PCR SCREENING     Status: Normal   Collection Time   01/27/12  5:48 PM      Component Value Range Status Comment   MRSA by PCR NEGATIVE  NEGATIVE Final   CULTURE, BLOOD (ROUTINE X 2)     Status: Normal (Preliminary result)   Collection Time   01/27/12  6:45 PM      Component Value Range Status Comment   Specimen Description BLOOD LEFT HAND   Final    Special Requests     Final    Value: BOTTLES DRAWN AEROBIC AND ANAEROBIC BLUE 10CC RED 5CC   Culture  Setup Time [id number]   Final    Culture     Final    Value:        BLOOD CULTURE RECEIVED NO GROWTH TO DATE CULTURE WILL BE HELD FOR 5 DAYS BEFORE ISSUING A FINAL NEGATIVE REPORT   Report Status PENDING   Incomplete   CULTURE, BLOOD (ROUTINE X 2)     Status: Normal (Preliminary result)   Collection Time   01/27/12  7:00 PM      Component Value Range Status Comment   Specimen Description BLOOD HAND RIGHT   Final    Special Requests BOTTLES DRAWN AEROBIC ONLY 5CC   Final    Culture  Setup Time [id number]   Final    Culture     Final    Value:        BLOOD CULTURE RECEIVED NO GROWTH TO DATE CULTURE WILL BE HELD FOR 5 DAYS BEFORE ISSUING A FINAL NEGATIVE REPORT   Report Status PENDING   Incomplete     Studies/Results: Dg Chest Port 1 View  01/28/2012  *RADIOLOGY REPORT*  Clinical Data: Cough and chest congestion  PORTABLE CHEST - 1 VIEW  Comparison: 06/28/2010  Findings: Examination quality is suboptimal due to use of portable technique.  New patchy right  lower lobe ill-defined airspace opacity is noted.  Heart size upper limits of normal.  No pleural effusion.  No acute osseous finding.  IMPRESSION: New patchy right lower lobe airspace opacity which could represent pneumonia given the clinical history.  Follow-up PA and lateral chest radiographs are  recommended for better evaluation when the patient is clinically able.  Original Report Authenticated By: Harrel Lemon, M.D.   Dg Abd Portable 1v  01/27/2012  *RADIOLOGY REPORT*  Clinical Data: Abdominal pain.  PORTABLE ABDOMEN - 1 VIEW  Comparison: 12/13/2010  Findings: There is a small amount of air scattered throughout normal-appearing bowel.  No visible free air or free fluid.  No excessive stool.  Phlebolith in the left side of the pelvis.  No osseous abnormality.  IMPRESSION: Benign-appearing abdomen.  Original Report Authenticated By: Gwynn Burly, M.D.    Medications: I have reviewed the patient's current medications. Scheduled Meds:   . antiseptic oral rinse  15 mL Mouth Rinse BID  . ferrous sulfate  325 mg Oral BID WC  . fluconazole (DIFLUCAN) IV  100 mg Intravenous Q24H  . insulin aspart  0-15 Units Subcutaneous TID WC  . insulin aspart  0-5 Units Subcutaneous QHS  . insulin glargine  10 Units Subcutaneous Daily  . levofloxacin (LEVAQUIN) IV  750 mg Intravenous Q24H  . loratadine  10 mg Oral Daily  . pantoprazole (PROTONIX) IV  40 mg Intravenous Q12H  . triamcinolone cream   Topical BID  . DISCONTD: sodium chloride   Intravenous Once   Continuous Infusions:   . sodium chloride 150 mL/hr at 01/29/12 0554   PRN Meds:.ALPRAZolam, bisacodyl, chlorpheniramine-HYDROcodone, dextrose, morphine injection, ondansetron (ZOFRAN) IV, ondansetron, oxymetazoline, promethazine, zolpidem Assessment/Plan: Patient Active Hospital Problem List: Acute on chronic blood loss anemia (01/27/2012)   Assessment: Nancy Singh is refusing any further blood transfusions and is also refusing Iron either by oral   Or IV route. I spent over 40 minutes explaining to patient and answering questions pertaining to her anemia and need to bulid her iron stores. Nancy Singh clearly wants to self-direct treatment and is not accepting of any recommendations of the medical team.   DM, UNCOMPLICATED, TYPE I, UNCONTROLLED (02/13/2007)   Assessment: Nancy Singh has a Hb A1c of 9.2 which indicates poor control. Again, patient is not receptive to recommendations of the medical team and nurse just reported that patient is now refusing her Lantus.    ANXIETY DEPRESSION (02/13/2007)   Assessment: Continue Xanax.   HYPERTENSION, BENIGN ESSENTIAL (02/13/2007)   Assessment: BP well controlled   HX, PERSONAL, PAST NONCOMPLIANCE (02/13/2007)   Assessment: Nancy Singh continues to exhibit non-compliance and feels that she knows what is best for her. She wants medications to be administered at her request,  but is not interested in the recommendations of the Medical  team.   Hyperglycemia ()   Assessment: Blood sugars within reasonable limits however patient still on a liquid to a by mouth diet. She is refusing her Lantus at this time. I last the diabetic educator to speak with her to see she can make some headway in terms of her impressing upon the patient the importance of better control of her diabetes.    GERD (gastroesophageal reflux disease) ()   Assessment: Nancy Singh has a history of GERD and gastritis.    Plan: Continue protonix  Ketoacidosis due to diabetes (01/27/2012)   Assessment: Resolved. Nancy Singh non-complaint with Lantus and Novolog.    Vagina, candidiasis (01/27/2012)   Assessment: Completed diflucan.   Dehydration (01/27/2012)   Assessment: On IVF   Intractable nausea and vomiting (01/27/2012)   Assessment: Nancy Singh has tolerated liquids and is requesting solid diet.   Acute bacterial sinusitis (01/27/2012)   Assessment: On Levaquin day #3/7.    LOS: 2 days

## 2012-01-29 NOTE — Care Management Note (Unsigned)
    Page 1 of 1   01/29/2012     2:12:34 PM   CARE MANAGEMENT NOTE 01/29/2012  Patient:  Nancy Singh, Nancy Singh   Account Number:  [id number]  Date Initiated:  01/28/2012  Documentation initiated by:  Alvira Philips Assessment:   51 yr-old female adm with uncontrolled DM, h/o noncompliance.     Action/Plan:   Anticipated DC Date:  01/30/2012   Anticipated DC Plan:  HOME/SELF CARE  In-house referral  Financial Counselor      DC Planning Services  CM consult      Choice offered to / List presented to:             Status of service:  In process, will continue to follow Medicare Important Message given?   (If response is "NO", the following Medicare IM given date fields will be blank) Date Medicare IM given:   Date Additional Medicare IM given:    Discharge Disposition:    Per UR Regulation:  Reviewed for med. necessity/level of care/duration of stay  If discussed at Long Length of Stay Meetings, dates discussed:    Comments:  01/29/12 Gave patient information on [employer] [employer]. Offered to make patient an appt but she stated that she would make an appt herself. Also gave patient information on [employer]. Discussed with patient importance of f/u appt and having PCP to assist her with DM care. She stated that she will make an appt to f/u and did not require any assistance.Gave patient pharmacy discount card and checked with pharmacy that patient is eligible for indigent fund. Contacted Artist and informed her of patient's room change.Financial counselor stated that she would call patient.  Will continue to follow for discharge needs. Jacquelynn Cree RN, BSN, CCM   01/28/12 1600 Henrietta Mayo RN MSN CCM [facility] appt was arranged for pt in 03/2011, pt states she was not satisfied and will not return to that clinic.  Pt has been working but did not pay for insurance because she did not think job would last, is currently not  working.  States she attempted disability application but did not understand paperwork.  Referral made to financial counselor.  Pt agrees with referral to [facility].

## 2012-01-30 ENCOUNTER — Encounter: Payer: Self-pay | Admitting: Internal Medicine

## 2012-01-30 DIAGNOSIS — J019 Acute sinusitis, unspecified: Secondary | ICD-10-CM

## 2012-01-30 DIAGNOSIS — E109 Type 1 diabetes mellitus without complications: Secondary | ICD-10-CM

## 2012-01-30 DIAGNOSIS — D649 Anemia, unspecified: Secondary | ICD-10-CM

## 2012-01-30 DIAGNOSIS — R7989 Other specified abnormal findings of blood chemistry: Secondary | ICD-10-CM

## 2012-01-30 LAB — GLUCOSE, CAPILLARY: Glucose-Capillary: 378 mg/dL — ABNORMAL HIGH (ref 70–99)

## 2012-01-30 MED ORDER — INSULIN ASPART 100 UNIT/ML ~~LOC~~ SOLN: 0.0000 [IU] | Freq: Three times a day (TID) | SUBCUTANEOUS | Status: DC

## 2012-01-30 MED ORDER — INSULIN GLARGINE 100 UNIT/ML ~~LOC~~ SOLN: 15.0000 [IU] | Freq: Every day | SUBCUTANEOUS | Status: DC

## 2012-01-30 MED ORDER — PANTOPRAZOLE SODIUM 40 MG PO TBEC: 40.0000 mg | DELAYED_RELEASE_TABLET | Freq: Every day | ORAL | Status: DC

## 2012-01-30 MED ORDER — LORATADINE 10 MG PO TABS: 10.0000 mg | ORAL_TABLET | Freq: Every day | ORAL | Status: DC

## 2012-01-30 MED ORDER — FERROUS SULFATE 325 (65 FE) MG PO TABS: 325.0000 mg | ORAL_TABLET | Freq: Two times a day (BID) | ORAL | Status: DC

## 2012-01-30 MED ORDER — PANTOPRAZOLE SODIUM 40 MG PO TBEC: 40.0000 mg | DELAYED_RELEASE_TABLET | Freq: Two times a day (BID) | ORAL | Status: DC

## 2012-01-30 MED ORDER — LEVOFLOXACIN 750 MG PO TABS
750.0000 mg | ORAL_TABLET | Freq: Every day | ORAL | Status: DC
  Filled 2012-01-30: qty 1

## 2012-01-30 MED ORDER — LEVOFLOXACIN 750 MG PO TABS: 750.0000 mg | ORAL_TABLET | Freq: Every day | ORAL | Status: DC

## 2012-01-30 MED ORDER — LEVOFLOXACIN 750 MG PO TABS: 750.0000 mg | ORAL_TABLET | Freq: Every day | ORAL | Status: AC

## 2012-01-30 NOTE — Progress Notes (Signed)
Writer called to patient's room around 1945 and asked about her meds. Writer told patient that she will check on what meds she was to get as Clinical research associate had checked earlier and noticed only one med to give at 2000 and the rest at 2200. Writer was in the middle of admission and discharge at the time. Writer was on her way to administer patient's ABT when called back in her room around 2130 and patient was upset about not getting her med on time and accused Clinical research associate of listening to what previous nurse is saying about her. Writer explained and appologised to her about the misunderstanding but patient insisted on leaving facility on AMA. Writer called charge nurse who also was unable to talk to patient. NP on call notified and came to see patient. Writer was in patient's room over 90 mins trying to convince her to stay and get treated and also to get cleaned up as she had started her menstrual period and was in a big mess. Patient insisted on leaving without being cleaned. NP and writer finally got patient to take her meds and to stay. Patient also accused tech of asking her too many times to get cleaned which was not true. After agreeing to stay, patient's attitude was different. Talking and laughing with Barista. Resting in bed at this time.

## 2012-01-30 NOTE — Discharge Summary (Signed)
Nancy Singh MRN: [id number] DOB/AGE: [birth year] 51 y.o.  Admit date: 01/27/2012 Discharge date: 01/30/2012  Primary Care Physician:  No primary provider on file.   Discharge Diagnoses:   Patient Active Problem List  Diagnosis  . DM, UNCOMPLICATED, TYPE I, UNCONTROLLED  . ANEMIA, DEFICIENCY NOS  . ANXIETY DEPRESSION  . ABUSE, ALCOHOL, EPISODIC  . TOBACCO USER  . HYPERTENSION, BENIGN ESSENTIAL  . PSORIASIS  . HX, PERSONAL, PAST NONCOMPLIANCE  . DM type 1 causing eye disease  . Diabetic retinopathy associated with type 1 diabetes mellitus  . Allergic rhinitis  . Hyperglycemia  . Cachexia  . GERD (gastroesophageal reflux disease)  . Acute blood loss anemia  . Ketoacidosis due to diabetes  . Dysuria  . UTI (lower urinary tract infection)  . Vagina, candidiasis  . Dehydration  . Intractable nausea and vomiting  . Acute bacterial sinusitis    DISCHARGE MEDICATION: Medication List  As of 01/30/2012  9:36 AM   TAKE these medications         ferrous sulfate 325 (65 FE) MG tablet   Take 1 tablet (325 mg total) by mouth 2 (two) times daily with a meal.      insulin aspart 100 UNIT/ML injection   Commonly known as: novoLOG   Inject 0-15 Units into the skin 3 (three) times daily with meals. CBG 121 - 150: 2 units,151 - 200: 3 units, 201 - 250: 5 units, 251 - 300: 8 units, 301 - 350: 11 units, 351 - 400: 15 units      insulin glargine 100 UNIT/ML injection   Commonly known as: LANTUS   Inject 15 Units into the skin daily.      levofloxacin 750 MG tablet   Commonly known as: LEVAQUIN   Take 1 tablet (750 mg total) by mouth at bedtime.      LIPITOR PO   Take 1 tablet by mouth daily.      loratadine 10 MG tablet   Commonly known as: CLARITIN   Take 1 tablet (10 mg total) by mouth daily.      pantoprazole 40 MG tablet   Commonly known as: PROTONIX   Take 1 tablet (40 mg total) by mouth daily.              Consults:     SIGNIFICANT DIAGNOSTIC STUDIES:  Dg  Chest Port 1 View  03-Feb-2012  *RADIOLOGY REPORT*  Clinical Data: Cough and chest congestion  PORTABLE CHEST - 1 VIEW  Comparison: 06/28/2010  Findings: Examination quality is suboptimal due to use of portable technique.  New patchy right lower lobe ill-defined airspace opacity is noted.  Heart size upper limits of normal.  No pleural effusion.  No acute osseous finding.  IMPRESSION: New patchy right lower lobe airspace opacity which could represent pneumonia given the clinical history.  Follow-up PA and lateral chest radiographs are recommended for better evaluation when the patient is clinically able.  Original Report Authenticated By: Harrel Lemon, M.D.   Dg Abd Portable 1v  01/27/2012  *RADIOLOGY REPORT*  Clinical Data: Abdominal pain.  PORTABLE ABDOMEN - 1 VIEW  Comparison: 12/13/2010  Findings: There is a small amount of air scattered throughout normal-appearing bowel.  No visible free air or free fluid.  No excessive stool.  Phlebolith in the left side of the pelvis.  No osseous abnormality.  IMPRESSION: Benign-appearing abdomen.  Original Report Authenticated By: Gwynn Burly, M.D.         Recent Results (from  the past 240 hour(s))  MRSA PCR SCREENING     Status: Normal   Collection Time   01/27/12  5:48 PM      Component Value Range Status Comment   MRSA by PCR NEGATIVE  NEGATIVE Final   CULTURE, BLOOD (ROUTINE X 2)     Status: Normal (Preliminary result)   Collection Time   01/27/12  6:45 PM      Component Value Range Status Comment   Specimen Description BLOOD LEFT HAND   Final    Special Requests     Final    Value: BOTTLES DRAWN AEROBIC AND ANAEROBIC BLUE 10CC RED 5CC   Culture  Setup Time [id number]   Final    Culture     Final    Value:        BLOOD CULTURE RECEIVED NO GROWTH TO DATE CULTURE WILL BE HELD FOR 5 DAYS BEFORE ISSUING A FINAL NEGATIVE REPORT   Report Status PENDING   Incomplete   CULTURE, BLOOD (ROUTINE X 2)     Status: Normal (Preliminary result)    Collection Time   01/27/12  7:00 PM      Component Value Range Status Comment   Specimen Description BLOOD HAND RIGHT   Final    Special Requests BOTTLES DRAWN AEROBIC ONLY 5CC   Final    Culture  Setup Time [id number]   Final    Culture     Final    Value:        BLOOD CULTURE RECEIVED NO GROWTH TO DATE CULTURE WILL BE HELD FOR 5 DAYS BEFORE ISSUING A FINAL NEGATIVE REPORT   Report Status PENDING   Incomplete   URINE CULTURE     Status: Normal   Collection Time   01/28/12  3:45 PM      Component Value Range Status Comment   Specimen Description URINE, RANDOM   Final    Special Requests Immunocompromised   Final    Culture  Setup Time [id number]   Final    Colony Count 35,000 COLONIES/ML   Final    Culture     Final    Value: Multiple bacterial morphotypes present, none predominant. Suggest appropriate recollection if clinically indicated.   Report Status 01/29/2012 FINAL   Final     BRIEF ADMITTING H & P:  [name] is a 51 year old female with diabetes type1 who has no chronic ongoing medical care. The patient apparently manages her diabetes with over-the-counter insulins. She presented to the emergency room with 3-4 days of progressive nausea and vomiting. She states that she noted blood in her vomitus. She reported that several days her blood sugars are greater than 500 and she was treating it with small doses of intermediate acting insulin. She presented to the emergency room and was found to have a blood sugar initially of 286 which rose to a value of almost 600. The patient was also found to have an anemia with hemoglobin of 7.6 in the setting of reported hematemesis. Please note the patient also reports heavy menses on a monthly basis.   Hospital Course:  Present on Admission:  .Acute on chronic blood loss anemia: The patient reports that her hemoglobin has been stable at approximately 7 for several years. She reports heavy menses. She apparently had some small amount of  hematemesis. Gastroneurology was consult and they recommended an EGD as the patient refused. The patient says she has a known history of gastritis for which she takes Protonix and did  not feel that another EGD was warranted as they have been nondiagnostic in the past. The patient received one unit of packed red blood cells in transfusion but did not want any further transfusions at this time. The patient was also found to have a significantly low serum iron of 16. IV iron was recommended in order for the patient however the patient refused. She's also been refusing the oral iron and she states that it causes her stomach upset and was not willing to negotiate another preparation of iron to assess oral tolerance. The risks and benefits have been discussed with the patient the patient states that she's comfortable with her decision and she is accepting of the risk of a significantly low hemoglobin and low iron without any replacement.   Marland KitchenKetoacidosis due to diabetes: At the time of admission the patient was in diabetic ketoacidosis. She was treated with insulin drip and then transitioned over to bolus and basal insulins. The patient was noncompliant with Lantus and refuse her Lantus for 2 days. She has accepted to 15 units of Lantus this morning with blood sugars ranging in the 300 range. Patient was also refuse any further lab draws to assess for her state of acidosis. She however states that she feels well she is fully awake alert and with normal mentation . The patient has been able to tolerate oral intake at this point without any difficulty  .Hyperglycemia: The patient is presently in a state of hyperglycemia with blood sugars in the 300 range as result of her refusing her long-acting insulin. However his feet with the patient this morning she has agreed to take 50 units of Lantus. The patient is being discharged and 50 units of Lantus and sliding scale insulin. I have expressed concern for this patient and  she has no followup for ongoing medical care. She had been a patient with [facility] is not intending to return there for services.  Case management  spoke with the patient and provided her with information for community clinical [place] as well as information for health department. The patient states that she will followup to make her own appointment.  .Dysuria: The patient had complaints of dysuria. A urinalysis was suggestive of a urinary tract infection however her urine culture showed less than 35,000 colonies with multiple bacterial morphotypes which was nondiagnostic of a UTI.   .Vagina, candidiasis: The patient was treated for vaginal candidiasis with Diflucan.   .Dehydration: The patient was clearly dehydrated state in light of her DKA. She was resuscitated with IV fluids and is now able to maintain hydration without artificial means.   .DM, UNCOMPLICATED, TYPE I.: The patient had a hemoglobin A1c of 9.5 which   diabetes. It is not surprising as the patient does not have ongoing care and is not on to her appropriate regimen for her diabetes type 1. However for personal reasons the patient was feels limited by financial constraints is choosing to treat her diabetes with whatever insulins she can obtain. We've recommended the patient be on Lantus and NovoLog. She's been given a prescription for these medications and he has directed her both community clinical [place] into the health department for further assistance  .Allergic rhinitis: Continue Claritin   .ANXIETY DEPRESSION: Patient was treated on an as-needed basis with Xanax hospitalized. However without ongoing care I do not feel it appropriate to start this patient on any type of psychotropic medication without good followup. I have discussed this with the patient and asked that  she discuss her concerns of anxiety or depression with primary care physician upon her office visit.   Marland KitchenGERD (gastroesophageal reflux  disease)/history of gastritis: The patient was on Protonix prior to hospitalization she is continued at the time of discharge.  .Intractable nausea and vomiting: The patient reported that when her blood sugars are out of control this level she has intractable nausea and vomiting which usually subsides when her blood sugars come into control. The patient's clinical course to follow this pattern and at this time the patient is tolerating oral intake without any difficulty she's had no further nausea or vomiting.   .Acute bacterial sinusitis: The patient was diagnosed with acute bacterial sinusitis and was treated with Levaquin. She is to continue 5 more days of antibiotics for completion of course.   Marland Kitchen Psychosocial: The patient has been described is possibly lacking of competency. In my extensive discussions with this patient this patient is alert and oriented x3 she is logical her thought process. She is albeit somewhat eccentric in her choices however her rationalization for her choices are very logical and make sense. Furthermore I. the patient's clinical course turned out as she had expected and predicted that it would. I find this patient has full capacity to make decisions for herself both indeterminate in her post discharge disposition and the choices and excepting or rejecting medical recommendations.  Disposition and Follow-up:  The patient is being discharged from care. She has been advised to follow up either of the [facility] community clinic or at the health department. The patient states that she knows to call the health Department for followup.  Discharge Orders    Future Orders Please Complete By Expires   Diet Carb Modified      Activity as tolerated - No restrictions         DISCHARGE EXAM:  General: Alert, awake, oriented x3, in no acute distress. Has a very flat affect but is very cooperative today. Patient states that she refuse to take her insulin yesterday and she was annoyed  with the nurse who is attending to her. Vital Signs:Blood pressure 119/67, pulse 86, temperature 98.7 F (37.1 C), temperature source Oral, resp. rate 20, height 5\' 1"  (1.549 m), weight 53.887 kg (118 lb 12.8 oz), last menstrual period 12/23/2011, SpO2 95.00%. HEENT: [place]/AT PEERL, EOMI  Neck: Trachea midline, no masses, no thyromegal,y no JVD, no carotid bruit  OROPHARYNX: Moist, No exudate/ erythema/lesions.  Heart: Regular rate and rhythm, without murmurs, rubs, gallops, PMI non-displaced, no heaves or thrills on palpation.  Lungs: Clear to auscultation, no wheezing or rhonchi noted. No increased vocal fremitus resonant to percussion  Abdomen: Soft, nontender, nondistended, positive bowel sounds, no masses no hepatosplenomegaly noted..  Neuro: No focal neurological deficits noted cranial nerves II through XII grossly intact. DTRs 2+ bilaterally upper and lower extremities. Strength functional in bilateral upper and lower extremities.  Musculoskeletal: No warm swelling or erythema around joints, no spinal tenderness noted.  Psychiatric: Patient alert and oriented x3. She has a flat affect she is logical and directed in her thinking.   Basename 01/29/12 0500 01/28/12 0220 01/27/12 1909  NA 137 139 --  K 3.5 4.0 --  CL 107 106 --  CO2 20 21 --  GLUCOSE 182* 150* --  BUN 10 18 --  CREATININE 0.92 0.98 --  CALCIUM 7.1* 7.9* --  MG -- -- 1.6  PHOS -- -- 4.3    Basename 01/28/12 0220 01/27/12 1330  AST 16 17  ALT  7 10  ALKPHOS 94 100  BILITOT 1.5* 0.4  PROT 6.2 8.2  ALBUMIN 2.9* 3.7    Basename 01/27/12 1330  LIPASE 8*  AMYLASE --    Basename 01/29/12 0500 01/28/12 0220 01/27/12 1330  WBC 11.1* 13.7* --  NEUTROABS -- -- 12.8*  HGB 7.1* 7.6* --  HCT 24.7* 25.3* --  MCV 63.3* 62.3* --  PLT 211 231 --    Total time for discharge process including face-to-face time approximately one hour.  Signed: Adams Hinch A. 01/30/2012, 9:36 AM

## 2012-01-30 NOTE — Progress Notes (Signed)
Subjective:  2200: Notified by RN that pt was stating she wanted to leave the hospital tonight. Pt had not been amenable to any suggestion by RN that she stay until tomorrow when MD rounds. RN reports pt very flat and making statements that were somewhat paranoid in nature. Was ask to come see pt.  Initially, upon my arrival pt adamant about wanting to leave and go home. Her reasoning seemed rambling and vague. She spoke of someone talking about her and being unable to sleep because she had to keep "one eye open". Did not seem to have a clear plan as to how she would get home and admits she has no plans for MD f/u but states "I'm not going back to [facility]" and "I'll work something out". When ask about her medications she states she can get most of her meds "over the counter". After much discussion pt agreed to stay until the MD rounds in the morning but states "I'm going home tomorrow".   Objective:  At bedside pt noted in no acute distress w/ very flat, blunt affect. Speech is very slow, soft and rambling at times. Shoulders slumped.  She is alert and oriented x 3. VS 117/97 T-98.6, P-75 R-20 02 sats 94% on R/A.   Assessment/Plan:  Pt has been refusing most care at this point and now wishes to be released. With some encouragement pt has agreed to stay until am to see MD. Given pt's hx of non-compliance, refusal of medical treatment and odd behavior tonight, question pt's competency. Will continue to monitor and make rounding MD aware of pt's desire to be discharged.

## 2012-01-30 NOTE — Progress Notes (Signed)
Patient called writer to her room and requested IV fluids stopped and saline lock removed. Fluids stopped and saline lock removed.

## 2012-02-03 LAB — CULTURE, BLOOD (ROUTINE X 2)
Culture  Setup Time: 201306110100
Culture  Setup Time: 201306110100
Culture: NO GROWTH
Culture: NO GROWTH

## 2012-08-28 ENCOUNTER — Encounter (HOSPITAL_COMMUNITY): Payer: Self-pay | Admitting: Emergency Medicine

## 2012-08-28 ENCOUNTER — Emergency Department (INDEPENDENT_AMBULATORY_CARE_PROVIDER_SITE_OTHER)
Admission: EM | Admit: 2012-08-28 | Discharge: 2012-08-28 | Disposition: A | Discharge status: Home / Self Care | Payer: Self-pay | Source: Home / Self Care | Attending: Family Medicine | Admitting: Family Medicine

## 2012-08-28 DIAGNOSIS — R030 Elevated blood-pressure reading, without diagnosis of hypertension: Secondary | ICD-10-CM

## 2012-08-28 NOTE — ED Provider Notes (Signed)
History     CSN: [id number]  Arrival date & time 08/28/12  1030   First MD Initiated Contact with Patient 08/28/12 1105      Chief Complaint  Patient presents with  . Hypertension    (Consider location/radiation/quality/duration/timing/severity/associated sxs/prior treatment) Patient is a 52 y.o. female presenting with hypertension. The history is provided by the patient.  Hypertension This is a new problem. The current episode started 1 to 2 hours ago (seen by derm this am with hbp, pt concerned, now bp much improved). The problem has been gradually improving. Pertinent negatives include no chest pain and no shortness of breath.    Past Medical History  Diagnosis Date  . DM type 1 causing eye disease   . Acid reflux   . High cholesterol   . Psoriasis   . Menorrhagia   . Anemia   . Hyperglycemia   . Allergic rhinitis   . Diabetic retinopathy associated with type 1 diabetes mellitus   . GERD (gastroesophageal reflux disease)   . Cachexia     History reviewed. No pertinent past surgical history.  No family history on file.  History  Substance Use Topics  . Smoking status: Never Smoker   . Smokeless tobacco: Not on file  . Alcohol Use: No    OB History    Grav Para Term Preterm Abortions TAB SAB Ect Mult Living                  Review of Systems  Constitutional: Negative.   Respiratory: Negative for shortness of breath.   Cardiovascular: Negative for chest pain.    Allergies  Review of patient's allergies indicates no known allergies.  Home Medications   Current Outpatient Rx  Name  Route  Sig  Dispense  Refill  . FERROUS SULFATE 325 (65 FE) MG PO TABS   Oral   Take 1 tablet (325 mg total) by mouth 2 (two) times daily with a meal.   60 tablet   0   . LIPITOR PO   Oral   Take 1 tablet by mouth daily.         . INSULIN ASPART 100 UNIT/ML [place] SOLN   Subcutaneous   Inject 0-15 Units into the skin 3 (three) times daily with meals. CBG 121 - 150: 2  units,151 - 200: 3 units, 201 - 250: 5 units, 251 - 300: 8 units, 301 - 350: 11 units, 351 - 400: 15 units   1 vial   0   . INSULIN GLARGINE 100 UNIT/ML [place] SOLN   Subcutaneous   Inject 15 Units into the skin daily.   10 mL   3   . LORATADINE 10 MG PO TABS   Oral   Take 1 tablet (10 mg total) by mouth daily.         Marland Kitchen PANTOPRAZOLE SODIUM 40 MG PO TBEC   Oral   Take 1 tablet (40 mg total) by mouth daily.   30 tablet   0     BP 158/85  Pulse 71  Temp 97.6 F (36.4 C) (Oral)  Resp 16  SpO2 99%  LMP 08/02/2012  Physical Exam  Nursing note and vitals reviewed. Constitutional: She is oriented to person, place, and time. She appears well-developed and well-nourished.  HENT:  Head: Normocephalic.  Right Ear: External ear normal.  Left Ear: External ear normal.  Mouth/Throat: Oropharynx is clear and moist.  Eyes: Conjunctivae normal are normal. Pupils are equal, round, and reactive  to light.  Neck: Normal range of motion. Neck supple.  Cardiovascular: Normal rate, regular rhythm, normal heart sounds and intact distal pulses.   Pulmonary/Chest: Effort normal and breath sounds normal.  Musculoskeletal: She exhibits no edema.  Neurological: She is alert and oriented to person, place, and time.  Skin: Skin is warm.    ED Course  Procedures (including critical care time)  Labs Reviewed - No data to display No results found.   1. White coat syndrome without hypertension       MDM          Linna Hoff, MD 08/28/12 1147

## 2012-08-28 NOTE — ED Notes (Signed)
Pt c/o hypertension States she was at the [facility] this am and her BP was 185/100 Says she was very nervous Was there to have her psoriasis checked but they weren't able due to her BP Was asked to come here and have it checked.  Denies: any medical prob; headaches, chest pain, blurry vision, SOB, edema No hx of HTN  She is alert and responsive w/no signs of acute distress

## 2012-09-17 ENCOUNTER — Emergency Department (INDEPENDENT_AMBULATORY_CARE_PROVIDER_SITE_OTHER)
Admission: EM | Admit: 2012-09-17 | Discharge: 2012-09-17 | Disposition: A | Discharge status: Home / Self Care | Payer: No Typology Code available for payment source | Source: Home / Self Care | Attending: Family Medicine | Admitting: Family Medicine

## 2012-09-17 ENCOUNTER — Encounter (HOSPITAL_COMMUNITY): Payer: Self-pay

## 2012-09-17 DIAGNOSIS — R64 Cachexia: Secondary | ICD-10-CM

## 2012-09-17 DIAGNOSIS — E10319 Type 1 diabetes mellitus with unspecified diabetic retinopathy without macular edema: Secondary | ICD-10-CM

## 2012-09-17 DIAGNOSIS — D62 Acute posthemorrhagic anemia: Secondary | ICD-10-CM

## 2012-09-17 DIAGNOSIS — E162 Hypoglycemia, unspecified: Secondary | ICD-10-CM

## 2012-09-17 DIAGNOSIS — J309 Allergic rhinitis, unspecified: Secondary | ICD-10-CM

## 2012-09-17 DIAGNOSIS — E1039 Type 1 diabetes mellitus with other diabetic ophthalmic complication: Secondary | ICD-10-CM

## 2012-09-17 DIAGNOSIS — K219 Gastro-esophageal reflux disease without esophagitis: Secondary | ICD-10-CM

## 2012-09-17 DIAGNOSIS — H579 Unspecified disorder of eye and adnexa: Secondary | ICD-10-CM

## 2012-09-17 LAB — COMPREHENSIVE METABOLIC PANEL
ALT: 9 U/L (ref 0–35)
CO2: 29 mEq/L (ref 19–32)
Calcium: 10.1 mg/dL (ref 8.4–10.5)
Chloride: 99 mEq/L (ref 96–112)
Creatinine, Ser: 0.92 mg/dL (ref 0.50–1.10)
GFR calc Af Amer: 83 mL/min — ABNORMAL LOW (ref >90)
GFR calc non Af Amer: 72 mL/min — ABNORMAL LOW (ref >90)
Glucose, Bld: 57 mg/dL — ABNORMAL LOW (ref 70–99)
Sodium: 138 mEq/L (ref 135–145)
Total Bilirubin: 0.3 mg/dL (ref 0.3–1.2)

## 2012-09-17 LAB — LIPID PANEL
HDL: 98 mg/dL (ref >39)
LDL Cholesterol: 198 mg/dL — ABNORMAL HIGH (ref 0–99)

## 2012-09-17 LAB — CBC
Hemoglobin: 13.1 g/dL (ref 12.0–15.0)
MCH: 27.6 pg (ref 26.0–34.0)
MCV: 83.4 fL (ref 78.0–100.0)
RBC: 4.75 MIL/uL (ref 3.87–5.11)
WBC: 8.1 10*3/uL (ref 4.0–10.5)

## 2012-09-17 LAB — TSH: TSH: 2.92 u[IU]/mL (ref 0.350–4.500)

## 2012-09-17 MED ORDER — LISINOPRIL 10 MG PO TABS: 10.0000 mg | ORAL_TABLET | Freq: Every day | ORAL | Status: DC

## 2012-09-17 MED ORDER — INSULIN GLARGINE 100 UNIT/ML ~~LOC~~ SOLN: 15.0000 [IU] | Freq: Every day | SUBCUTANEOUS | Status: DC

## 2012-09-17 MED ORDER — INSULIN ASPART 100 UNIT/ML ~~LOC~~ SOLN: 0.0000 [IU] | Freq: Three times a day (TID) | SUBCUTANEOUS | Status: DC

## 2012-09-17 NOTE — ED Notes (Signed)
Patient states blood pressure has been up and down-has had transfusions in the past and is worried about her hgb

## 2012-09-17 NOTE — ED Provider Notes (Signed)
History     CSN: [id number]  Arrival date & time 09/17/12  1507   First MD Initiated Contact with Patient 09/17/12 1520      Chief Complaint  Patient presents with  . Hypertension   HPI Pt says that she was diagnosed with type 1 diabetes mellitus at the age of 8.   She has lost her job and has not had medical insurance in past 2 years.  She has been buying insulin at [facility].  Pt says that she is taking novolin R per sliding scale given to her by her previous physician at [facility].  Pt reports that she checks her BS TID and she gets 60-200.  Pt says that she is having no hypoglycemic reactions at home.   Pt also says that she was taking meds for anxiety but did not bring any medical records with her.  Patient has a long history of poor compliance with taking her medications.  She had not been followed recently by the health service clinic but she had been going to [employer] in buying insulin over-the-counter to survive.  She has a high A1c and she has accepted that.  She tells me today that she does not like to take medications and she likes natural alternatives to medical therapies.  She would like to try going to the health department for assistance with her insulins.  She reports that she has a history of anemia and she periodically has her blood counts tested.  She would like to have her CBC tested today.  Past Medical History  Diagnosis Date  . DM type 1 causing eye disease   . Acid reflux   . High cholesterol   . Psoriasis   . Menorrhagia   . Anemia   . Hyperglycemia   . Allergic rhinitis   . Diabetic retinopathy associated with type 1 diabetes mellitus   . GERD (gastroesophageal reflux disease)   . Cachexia     History reviewed. No pertinent past surgical history.  No family history on file.  History  Substance Use Topics  . Smoking status: Never Smoker   . Smokeless tobacco: Not on file  . Alcohol Use: No    OB History    Grav Para Term Preterm Abortions TAB SAB  Ect Mult Living                 Review of Systems  HENT: Positive for congestion.   Genitourinary: Positive for urgency and frequency.       Nocturia  All other systems reviewed and are negative.    Allergies  Review of patient's allergies indicates no known allergies.  Home Medications   Current Outpatient Rx  Name  Route  Sig  Dispense  Refill  . LIPITOR PO   Oral   Take 1 tablet by mouth daily.         Marland Kitchen FERROUS SULFATE 325 (65 FE) MG PO TABS   Oral   Take 1 tablet (325 mg total) by mouth 2 (two) times daily with a meal.   60 tablet   0   . INSULIN ASPART 100 UNIT/ML [place] SOLN   Subcutaneous   Inject 0-15 Units into the skin 3 (three) times daily with meals. CBG 121 - 150: 2 units,151 - 200: 3 units, 201 - 250: 5 units, 251 - 300: 8 units, 301 - 350: 11 units, 351 - 400: 15 units   1 vial   0   . INSULIN GLARGINE 100 UNIT/ML  [place] SOLN   Subcutaneous   Inject 15 Units into the skin daily.   10 mL   3   . LORATADINE 10 MG PO TABS   Oral   Take 1 tablet (10 mg total) by mouth daily.         Marland Kitchen PANTOPRAZOLE SODIUM 40 MG PO TBEC   Oral   Take 1 tablet (40 mg total) by mouth daily.   30 tablet   0     BP 148/91  Pulse 89  Temp 97.6 F (36.4 C) (Oral)  Resp 18  SpO2 100%  LMP 09/15/2012  Physical Exam  Nursing note and vitals reviewed. Constitutional: She is oriented to person, place, and time. She appears well-developed and well-nourished. No distress.       Cachectic and pale  HENT:  Head: Normocephalic and atraumatic.  Eyes: Conjunctivae normal and EOM are normal. Pupils are equal, round, and reactive to light.  Neck: Normal range of motion. Neck supple. No JVD present. No thyromegaly present.  Cardiovascular: Normal rate, regular rhythm and normal heart sounds.   Pulmonary/Chest: Effort normal and breath sounds normal. No respiratory distress.  Abdominal: Soft. Bowel sounds are normal.  Musculoskeletal: Normal range of motion. She exhibits no  edema and no tenderness.  Lymphadenopathy:    She has no cervical adenopathy.  Neurological: She is alert and oriented to person, place, and time.  Skin: Skin is warm and dry. No erythema.  Psychiatric: She has a normal mood and affect. Her behavior is normal. Judgment and thought content normal.    ED Course  Procedures (including critical care time)  Labs Reviewed - No data to display No results found.  No diagnosis found.  MDM  IMPRESSION  Hyperlipidemia  Hypertension  Type 2 Diabetes Mellitus  Anemia  Long history of noncompliance with taking medications and following up  History of tobacco use  History of alcohol use  RECOMMENDATIONS / PLAN Pt declined to get flu vaccine today.   Trial of lisinopril 10 mg po daily.  The patient reports that she has taken this in the past. The patient reports that she no longer uses alcohol or tobacco.  The patient also has a history of hyperlipidemia and dyslipidemia and had taken atorvastatin in the past.  We will check her lipid panel today and make a decision about starting a statin for her.  She will likely not be able to afford atorvastatin at this time but we can try something on the $4 [employer] list. Requested patient to sign a release for medical records so that we can try and obtain some type of medical records from her.  This may be impossible because the patient had not been seen at the health service clinic and several years. The patient is not in a stage of change where she is accepting of medical advise.  She likely may not followup given the history that she admits to me today.  She also admits to me that her hemoglobin A1c is going to come back very high.  I explained to the patient that she will be at high-risk for acute and chronic complications of poorly controlled diabetes mellitus if her A1c and her diabetes is as uncontrolled and she reports.  The patient verbalized understanding.  FOLLOW UP 2 weeks for followup  blood pressure and blood glucose  The patient was given clear instructions to go to ER or return to medical center if symptoms don't improve, worsen or new problems develop.  The patient verbalized understanding.  The patient was told to call to get lab results if they haven't heard anything in the next week.            Cleora Fleet, MD 09/17/12 1941

## 2012-09-18 LAB — HEMOGLOBIN A1C: Hgb A1c MFr Bld: 9.4 % — ABNORMAL HIGH (ref <5.7)

## 2012-09-21 IMAGING — CR DG ABDOMEN ACUTE W/ 1V CHEST
3 series · 3 of 3 positions shown · non-contrast
Comparison: Chest radiograph performed 06/28/2010, and abdominal
radiograph performed 09/07/2009

CLINICAL DATA: Upper abdominal pain and vomiting.

ACUTE ABDOMEN SERIES (ABDOMEN 2 VIEW & CHEST 1 VIEW)

[w chest pa]
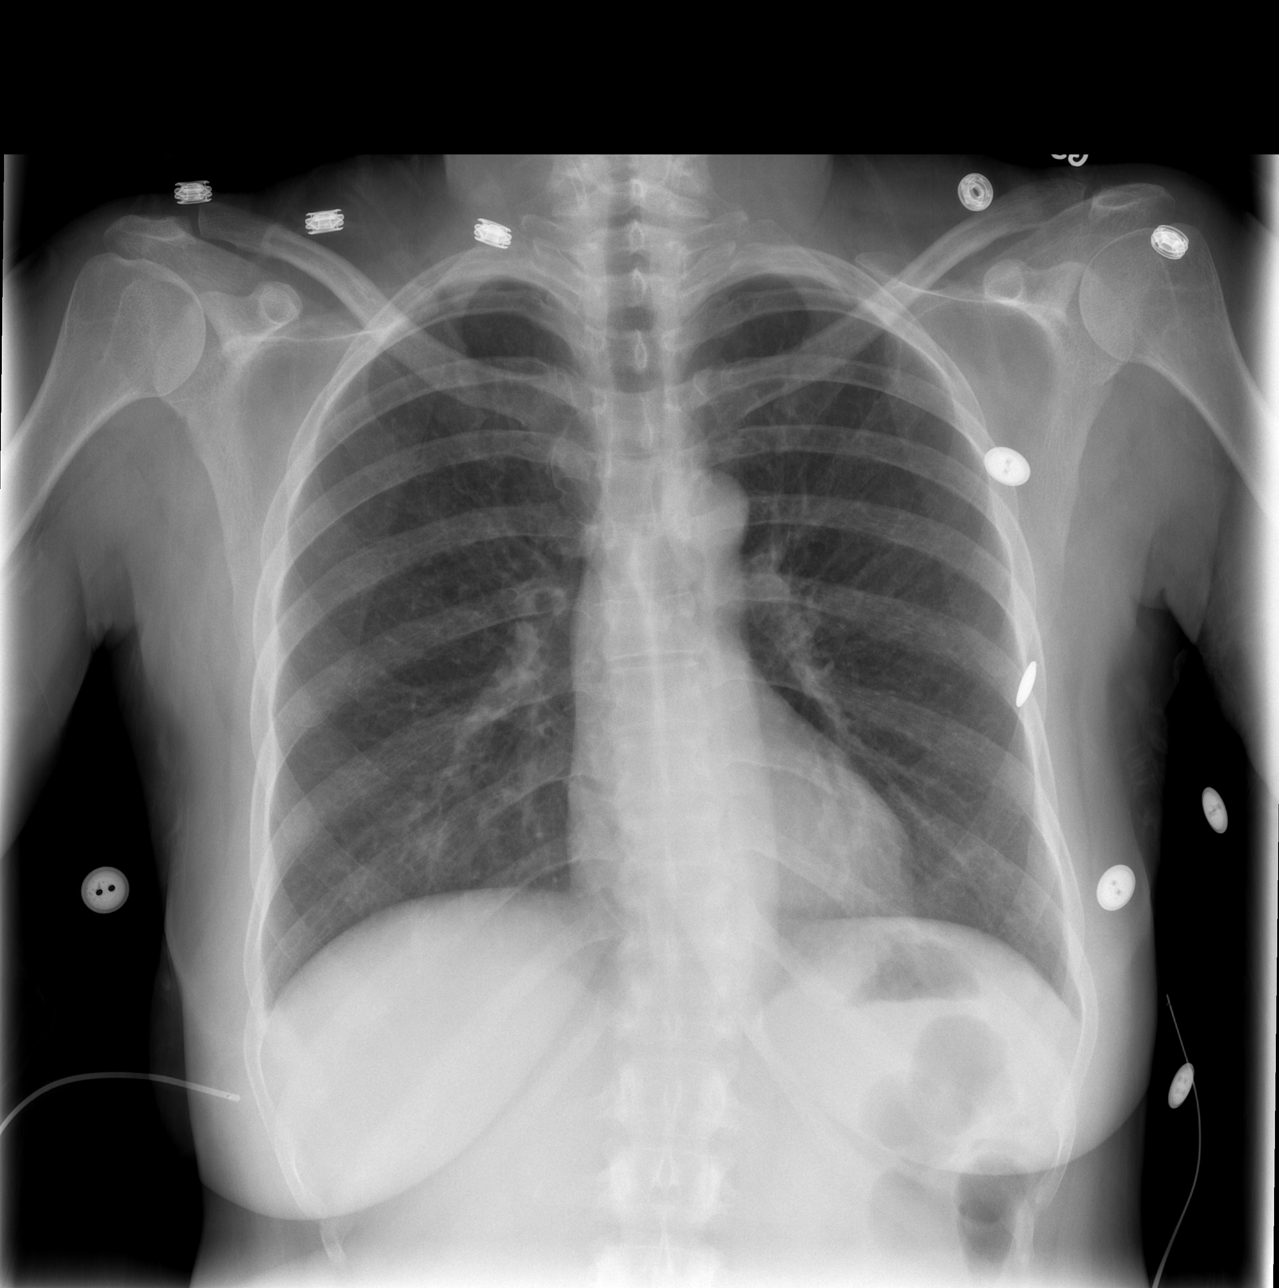

[w abdomen upright]
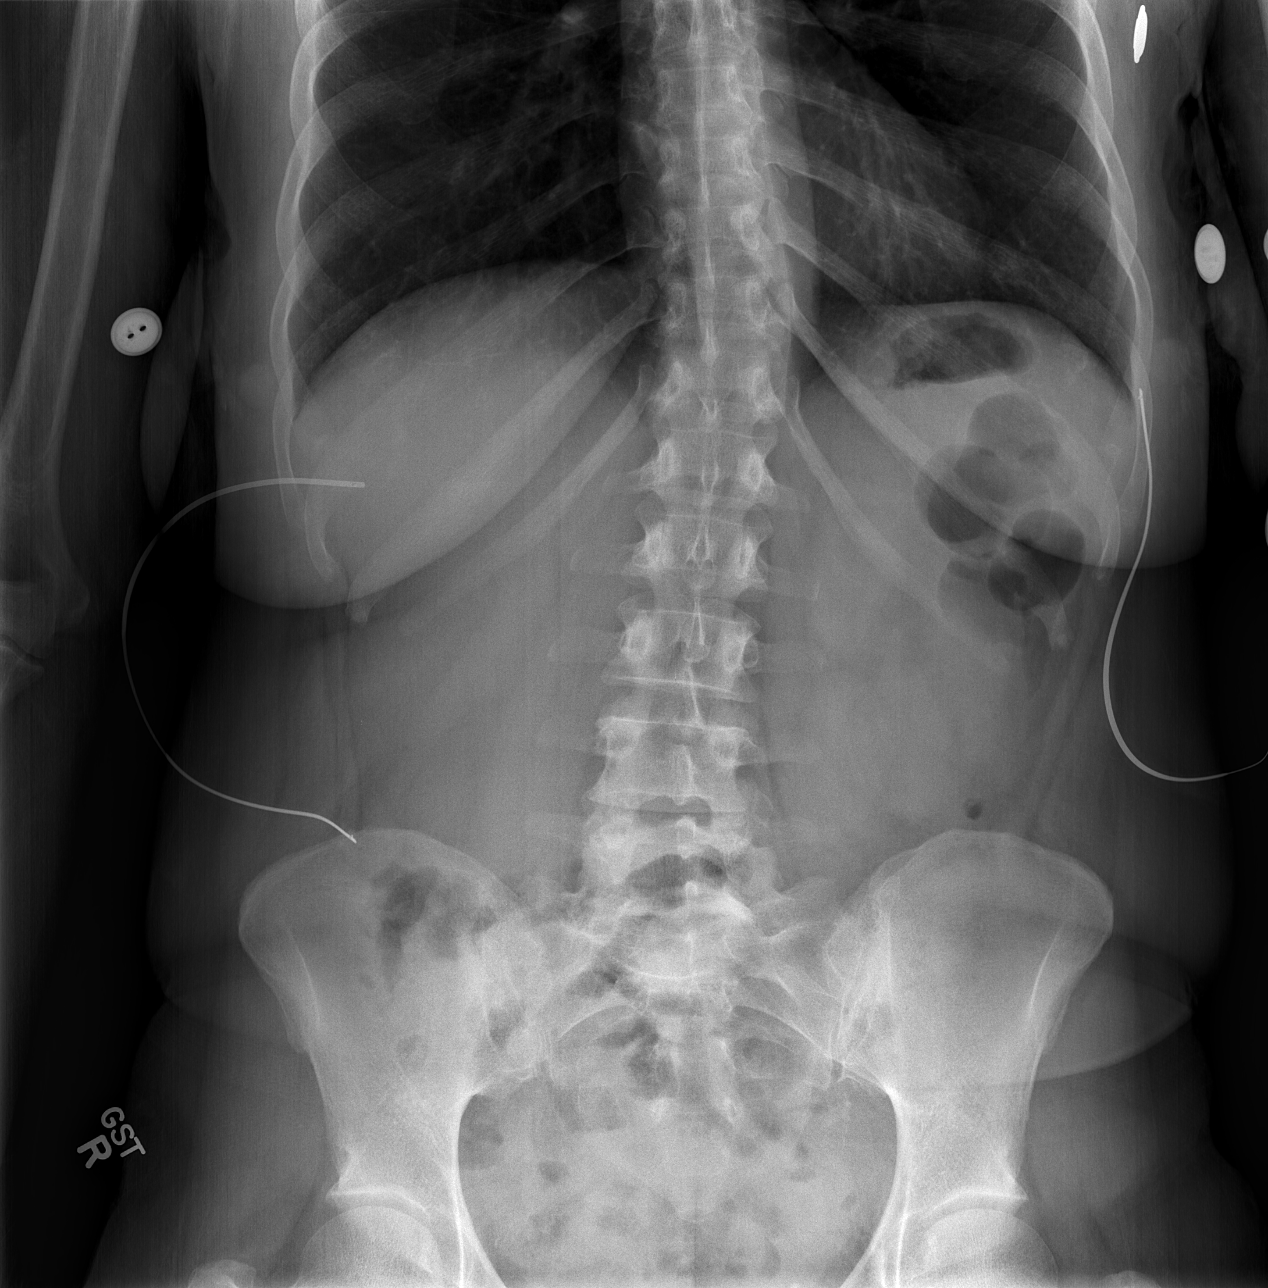

[t abdomen supine]
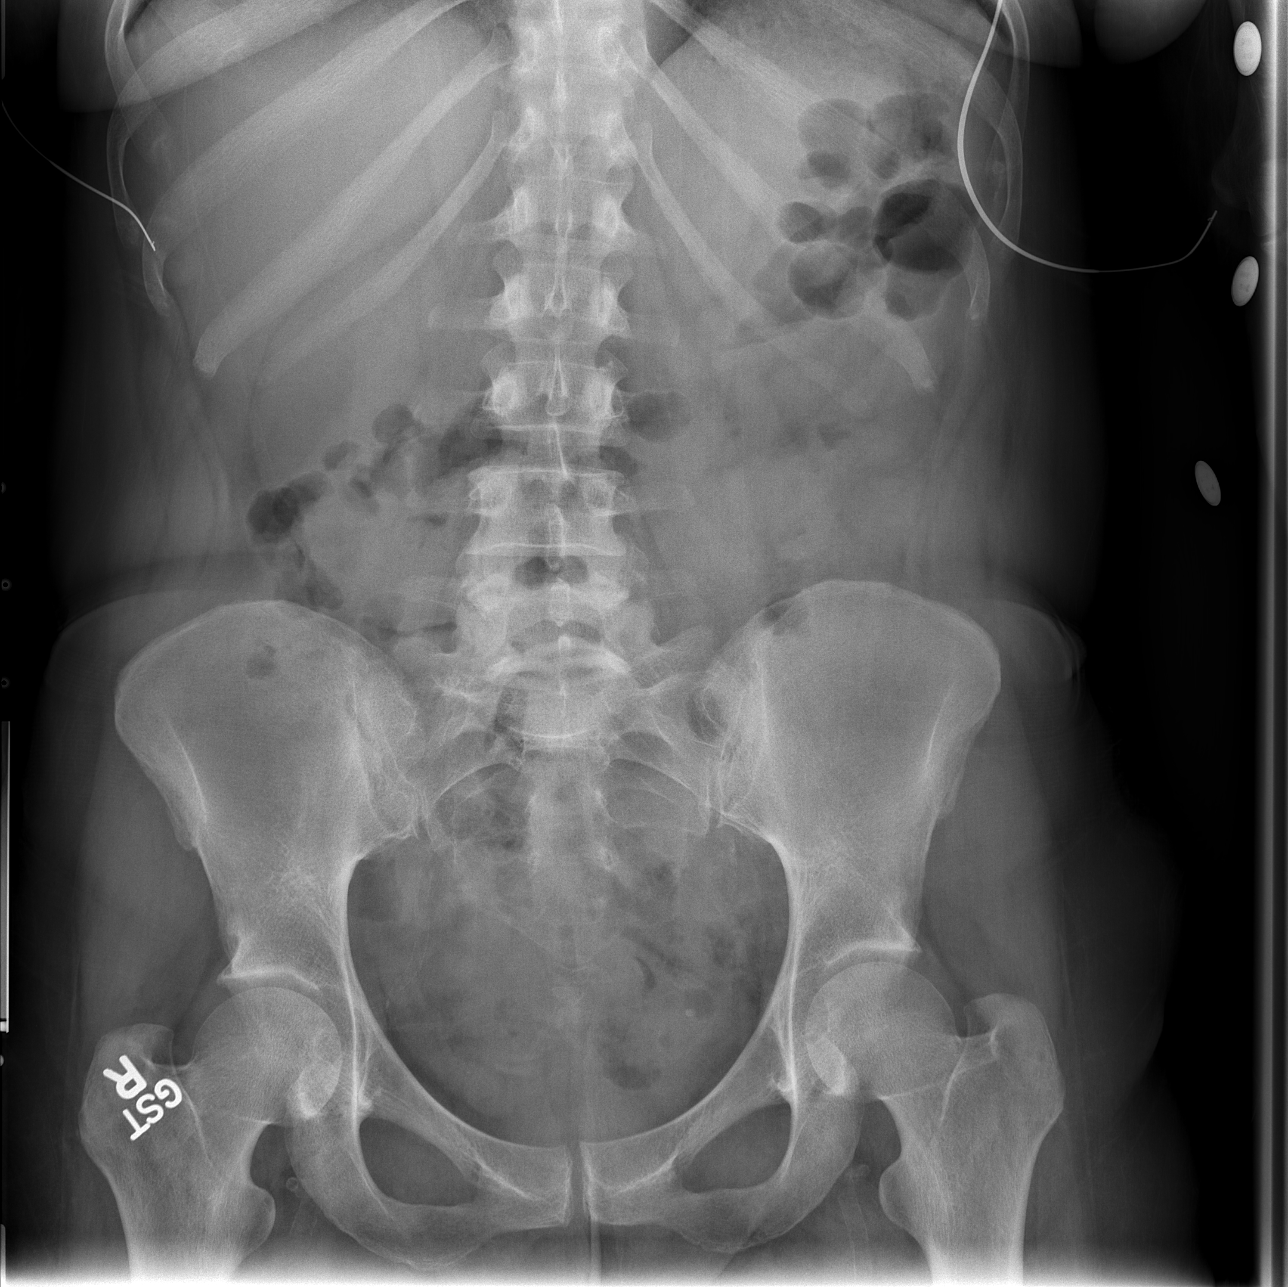

[3 of 3 positions shown; findings below may reference images not displayed]

FINDINGS: The lungs are well-aerated and clear.  There is no
evidence of focal opacification, pleural effusion or pneumothorax.
The cardiomediastinal silhouette is within normal limits.

The visualized bowel gas pattern is unremarkable. Stool and air are
noted throughout the colon; there is no evidence of small bowel
dilatation to suggest obstruction.  No free intra-abdominal air is
identified on the provided upright view.

No acute osseous abnormalities are seen; the sacroiliac joints are
unremarkable in appearance.
IMPRESSION: 1.  Unremarkable bowel gas pattern; no free intra-abdominal air
seen.
2.  No acute cardiopulmonary process identified.

## 2012-09-21 NOTE — ED Notes (Signed)
[facility] patient calling about her labs. Referred to [facility]

## 2012-09-23 NOTE — Progress Notes (Signed)
Quick Note:  Pt was notified in person of her lab results when she came to pick him up. Her glycemic control is poor and has been stable over the past several years. Her lipids are also out of control. Please call patient and find out if she is taking her cholesterol medications. If she needs [place] to call in a prescription for her cholesterol medication please let me know as soon as possible. I recommend that we recheck her labs in 3 months.  Rodney Langton, MD, CDE, FAAFP [facility] [place], [place]   ______

## 2012-10-31 ENCOUNTER — Encounter (HOSPITAL_COMMUNITY): Payer: Self-pay | Admitting: Emergency Medicine

## 2012-10-31 ENCOUNTER — Emergency Department (INDEPENDENT_AMBULATORY_CARE_PROVIDER_SITE_OTHER)
Admission: EM | Admit: 2012-10-31 | Discharge: 2012-10-31 | Disposition: A | Discharge status: Home / Self Care | Payer: No Typology Code available for payment source | Source: Home / Self Care

## 2012-10-31 DIAGNOSIS — F411 Generalized anxiety disorder: Secondary | ICD-10-CM

## 2012-10-31 DIAGNOSIS — F419 Anxiety disorder, unspecified: Secondary | ICD-10-CM

## 2012-10-31 DIAGNOSIS — E162 Hypoglycemia, unspecified: Secondary | ICD-10-CM

## 2012-10-31 LAB — GLUCOSE, CAPILLARY: Glucose-Capillary: 35 mg/dL — CL (ref 70–99)

## 2012-10-31 MED ORDER — CLONAZEPAM 0.5 MG PO TABS: 0.5000 mg | ORAL_TABLET | Freq: Two times a day (BID) | ORAL | Status: DC | PRN

## 2012-10-31 NOTE — ED Notes (Signed)
Pt is c/o symptoms of anxiety. Recently under a lot stress. Cant sleep. Constantly worried. Chills and up set stomach  Pt has tried relaxing with no relief.

## 2012-10-31 NOTE — ED Notes (Signed)
Dr Thad Ranger notified of CBG results

## 2012-10-31 NOTE — ED Provider Notes (Signed)
History     CSN: [id number]  Arrival date & time 10/31/12  1642   First MD Initiated Contact with Patient 10/31/12 1736      Chief Complaint  Patient presents with  . Anxiety    constantly worried. cant sleep. chills. under a lot of stress. up set stomach.     (Consider location/radiation/quality/duration/timing/severity/associated sxs/prior treatment) HPI 52 y.o. female with anxiety, fatigue, general malaise.  Stressful time - lost job 3 mos ago. Can't sleep, constant worrying, reliving problems experienced in past. Several years ago was on valium prn for anxiety. States it helped a lot. Only took occasionally.  Does not currently have a psychiatrist or counselor. Recent anxiety has been severe. No physical symptoms but just doesn't feel good. Tired. Has had recent cough/URI. Mild nausea. Patient is diabetic on insulin, last A1C 9.4 in January 2014.Blood sugars in 180-210 last few days.  No fever/chills. Has history of anemia needing transfusion 5-7 years ago. Hgb 13 one month ago. LMP 2-3 weeks ago, lasted 5 days, moderate flow. Cold intolerance but recent TSH 2.9 (one month ago).  Patient denies sadness, hopelessness, guilt, suicidal ideation. Does have decreased energy and pleasure.  Past Medical History  Diagnosis Date  . DM type 1 causing eye disease   . Acid reflux   . High cholesterol   . Psoriasis   . Menorrhagia   . Anemia   . Hyperglycemia   . Allergic rhinitis   . Diabetic retinopathy associated with type 1 diabetes mellitus   . GERD (gastroesophageal reflux disease)   . Cachexia     History reviewed. No pertinent past surgical history.  History reviewed. No pertinent family history.  History  Substance Use Topics  . Smoking status: Never Smoker   . Smokeless tobacco: Not on file  . Alcohol Use: No    OB History   Grav Para Term Preterm Abortions TAB SAB Ect Mult Living                  Review of Systems  Constitutional: Positive for appetite change  and fatigue. Negative for fever, chills and diaphoresis.  HENT: Negative for congestion, sore throat and rhinorrhea.   Eyes: Negative for visual disturbance.  Respiratory: Positive for cough. Negative for chest tightness, shortness of breath and wheezing.   Cardiovascular: Negative for chest pain and palpitations.  Gastrointestinal: Positive for nausea. Negative for vomiting, abdominal pain, diarrhea and constipation.  Endocrine: Positive for cold intolerance.  Genitourinary: Negative for dysuria and vaginal bleeding.  Musculoskeletal: Negative for myalgias and joint swelling.  Neurological: Negative for dizziness, weakness, numbness and headaches.    Allergies  Review of patient's allergies indicates no known allergies.  Home Medications   Current Outpatient Rx  Name  Route  Sig  Dispense  Refill  . Atorvastatin Calcium (LIPITOR PO)   Oral   Take 1 tablet by mouth daily.         . insulin aspart (NOVOLOG) 100 UNIT/ML injection   Subcutaneous   Inject 0-15 Units into the skin 3 (three) times daily with meals. CBG 121 - 150: 2 units,151 - 200: 3 units, 201 - 250: 5 units, 251 - 300: 8 units, 301 - 350: 11 units, 351 - 400: 15 units   1 vial   3   . insulin glargine (LANTUS) 100 UNIT/ML injection   Subcutaneous   Inject 15 Units into the skin daily.   10 mL   3   . lisinopril (PRINIVIL,ZESTRIL) 10 MG  tablet   Oral   Take 1 tablet (10 mg total) by mouth daily.   30 tablet   1   . loratadine (CLARITIN) 10 MG tablet   Oral   Take 1 tablet (10 mg total) by mouth daily.         . ferrous sulfate 325 (65 FE) MG tablet   Oral   Take 1 tablet (325 mg total) by mouth 2 (two) times daily with a meal.   60 tablet   0   . pantoprazole (PROTONIX) 40 MG tablet   Oral   Take 1 tablet (40 mg total) by mouth daily.   30 tablet   0     BP 155/93  Pulse 97  Temp(Src) 98.3 F (36.8 C) (Oral)  Resp 22  SpO2 100%  LMP 10/03/2012   Physical Exam  Constitutional: She is  oriented to person, place, and time. She appears well-developed and well-nourished. No distress.  HENT:  Head: Normocephalic and atraumatic.  Mouth/Throat: Oropharynx is clear and moist.  Eyes: Conjunctivae and EOM are normal. Pupils are equal, round, and reactive to light.  Neck: Normal range of motion. Neck supple. No thyromegaly present.  Cardiovascular: Normal rate, regular rhythm and normal heart sounds.   Pulmonary/Chest: Effort normal and breath sounds normal. No respiratory distress. She has no wheezes.  Abdominal: Soft. Bowel sounds are normal. There is no tenderness. There is no rebound and no guarding.  Musculoskeletal: Normal range of motion. She exhibits no edema and no tenderness.  Lymphadenopathy:    She has no cervical adenopathy.  Neurological: She is alert and oriented to person, place, and time.  Skin: Skin is warm and dry.  Psychiatric:  Anxious    ED Course  Procedures (including critical care time)  Labs Reviewed  GLUCOSE, CAPILLARY - Abnormal; Notable for the following:    Glucose-Capillary 35 (*)    All other components within normal limits   No results found.   1. Anxiety   2. Hypoglycemia     MDM  Blood sugar increased to 103 after Ensure. Clonazepam 0.5 mg bid prn for short term anxiety relief. Pt to f/u in [facility] medicine clinic. Discussed need to be on long-term medication for anxiety not just benzodiazepine - discussed side effects, addiction potential of benzo. Pt encouraged to get counseling. Also discussed importance of good diabetic control and regular diet.  Napoleon Form, MD      Napoleon Form, MD 10/31/12 2011

## 2012-11-02 LAB — GLUCOSE, CAPILLARY: Glucose-Capillary: 103 mg/dL — ABNORMAL HIGH (ref 70–99)

## 2012-11-10 ENCOUNTER — Emergency Department (INDEPENDENT_AMBULATORY_CARE_PROVIDER_SITE_OTHER)
Admission: EM | Admit: 2012-11-10 | Discharge: 2012-11-10 | Disposition: A | Discharge status: Home / Self Care | Payer: No Typology Code available for payment source | Source: Home / Self Care | Attending: Family Medicine | Admitting: Family Medicine

## 2012-11-10 ENCOUNTER — Encounter (HOSPITAL_COMMUNITY): Payer: Self-pay

## 2012-11-10 DIAGNOSIS — J309 Allergic rhinitis, unspecified: Secondary | ICD-10-CM

## 2012-11-10 DIAGNOSIS — R739 Hyperglycemia, unspecified: Secondary | ICD-10-CM

## 2012-11-10 DIAGNOSIS — R64 Cachexia: Secondary | ICD-10-CM

## 2012-11-10 DIAGNOSIS — E1039 Type 1 diabetes mellitus with other diabetic ophthalmic complication: Secondary | ICD-10-CM

## 2012-11-10 DIAGNOSIS — E10319 Type 1 diabetes mellitus with unspecified diabetic retinopathy without macular edema: Secondary | ICD-10-CM

## 2012-11-10 DIAGNOSIS — H579 Unspecified disorder of eye and adnexa: Secondary | ICD-10-CM

## 2012-11-10 DIAGNOSIS — I10 Essential (primary) hypertension: Secondary | ICD-10-CM

## 2012-11-10 DIAGNOSIS — E11319 Type 2 diabetes mellitus with unspecified diabetic retinopathy without macular edema: Secondary | ICD-10-CM

## 2012-11-10 DIAGNOSIS — F418 Other specified anxiety disorders: Secondary | ICD-10-CM

## 2012-11-10 DIAGNOSIS — F341 Dysthymic disorder: Secondary | ICD-10-CM

## 2012-11-10 DIAGNOSIS — E860 Dehydration: Secondary | ICD-10-CM

## 2012-11-10 MED ORDER — ATORVASTATIN CALCIUM 20 MG PO TABS: 20.0000 mg | ORAL_TABLET | Freq: Every day | ORAL | Status: DC

## 2012-11-10 MED ORDER — FERROUS SULFATE 325 (65 FE) MG PO TABS: 325.0000 mg | ORAL_TABLET | Freq: Two times a day (BID) | ORAL | Status: DC

## 2012-11-10 MED ORDER — PANTOPRAZOLE SODIUM 40 MG PO TBEC: 40.0000 mg | DELAYED_RELEASE_TABLET | Freq: Every day | ORAL | Status: DC

## 2012-11-10 MED ORDER — CLONAZEPAM 0.5 MG PO TABS: 0.5000 mg | ORAL_TABLET | Freq: Two times a day (BID) | ORAL | Status: DC | PRN

## 2012-11-10 NOTE — ED Provider Notes (Signed)
History     CSN: [id number]  Arrival date & time 11/10/12  1633   First MD Initiated Contact with Patient 11/10/12 1807      Chief Complaint  Patient presents with  . Medication Refill   HPI Pt reports that she is suffering from depression and anxiety.  She was seen in urgent care recently and given a prescription for clonipin which she reports did help some with her anxiety.  She has long suffered from depression of chronic disease.  She has problems with self-esteem.  She hates to look at herself in the mirror.  She feels like she is being stabbed in her arms at times.  She reports that she has not thoughts of harming herself or others.  She has long had type 1 DM and it has been poorly controlled from her admitted poor adherence.  She has been discharged from multiple medical practices for many reasons per patient.  She does not doctors and does not like to listen to them she reports.  She says that she is willing to get help now.  She says that she is more depressed now because she has no job and little income.  She has been able to get her orange discount card and get establised with the MAP program for assistance with her insulin.   Past Medical History  Diagnosis Date  . DM type 1 causing eye disease   . Acid reflux   . High cholesterol   . Psoriasis   . Menorrhagia   . Anemia   . Hyperglycemia   . Allergic rhinitis   . Diabetic retinopathy associated with type 1 diabetes mellitus   . GERD (gastroesophageal reflux disease)   . Cachexia    History reviewed. No pertinent past surgical history.  No family history on file.  History  Substance Use Topics  . Smoking status: Never Smoker   . Smokeless tobacco: Not on file  . Alcohol Use: No    OB History   Grav Para Term Preterm Abortions TAB SAB Ect Mult Living                 Review of Systems  Constitutional: Negative for fatigue and unexpected weight change.  HENT: Positive for congestion.   Genitourinary: Positive  for urgency and frequency.  Neurological: Positive for weakness.  Psychiatric/Behavioral: Positive for sleep disturbance, dysphoric mood, decreased concentration and agitation. Negative for suicidal ideas and self-injury. The patient is nervous/anxious. The patient is not hyperactive.   All other systems reviewed and are negative.    Allergies  Review of patient's allergies indicates no known allergies.  Home Medications   Current Outpatient Rx  Name  Route  Sig  Dispense  Refill  . Atorvastatin Calcium (LIPITOR PO)   Oral   Take 1 tablet by mouth daily.         . clonazePAM (KLONOPIN) 0.5 MG tablet   Oral   Take 1 tablet (0.5 mg total) by mouth 2 (two) times daily as needed for anxiety.   10 tablet   0   . ferrous sulfate 325 (65 FE) MG tablet   Oral   Take 1 tablet (325 mg total) by mouth 2 (two) times daily with a meal.   60 tablet   0   . insulin aspart (NOVOLOG) 100 UNIT/ML injection   Subcutaneous   Inject 0-15 Units into the skin 3 (three) times daily with meals. CBG 121 - 150: 2 units,151 - 200: 3 units,  201 - 250: 5 units, 251 - 300: 8 units, 301 - 350: 11 units, 351 - 400: 15 units   1 vial   3   . insulin glargine (LANTUS) 100 UNIT/ML injection   Subcutaneous   Inject 15 Units into the skin daily.   10 mL   3   . lisinopril (PRINIVIL,ZESTRIL) 10 MG tablet   Oral   Take 1 tablet (10 mg total) by mouth daily.   30 tablet   1   . loratadine (CLARITIN) 10 MG tablet   Oral   Take 1 tablet (10 mg total) by mouth daily.         . pantoprazole (PROTONIX) 40 MG tablet   Oral   Take 1 tablet (40 mg total) by mouth daily.   30 tablet   0    BP 129/90  Pulse 84  Temp(Src) 97.5 F (36.4 C) (Oral)  SpO2 100%  LMP 10/03/2012  Physical Exam  Nursing note and vitals reviewed. Constitutional: She is oriented to person, place, and time. She appears well-developed and well-nourished. No distress.  HENT:  Head: Normocephalic and atraumatic.  Eyes:  Pupils are equal, round, and reactive to light.  Cardiovascular: Normal rate, regular rhythm and normal heart sounds.  Exam reveals no gallop and no friction rub.   No murmur heard. Pulmonary/Chest: Effort normal and breath sounds normal.  Musculoskeletal: Normal range of motion. She exhibits no edema and no tenderness.  Neurological: She is alert and oriented to person, place, and time.  Skin: Skin is warm and dry.  Psychiatric: Her speech is normal and behavior is normal. Judgment normal. Her mood appears anxious. Her affect is labile. Her affect is not blunt and not inappropriate. Thought content is not paranoid and not delusional. Cognition and memory are normal. She exhibits a depressed mood. She expresses no homicidal and no suicidal ideation. She expresses no suicidal plans and no homicidal plans.    ED Course  Procedures (including critical care time)  Labs Reviewed - No data to display No results found.  No diagnosis found.  MDM  IMPRESSION  Type 1 diabetes mellitus  Major depression  Anxiety disorder  Diabetic neuropathy  History of hypoglycemia  History of noncompliance with medical care   RECOMMENDATIONS / PLAN Pt was contracted for safety.  Pt says that she is not suicidal or homicidal.  Pt having significant anxiety and depression.  Pt was given the 24/7 behavioral health hotline and asked to contact them today to get established for behavioral healthcare.  If her symptoms worsened, I asked her to go to ER for assistance.  I gave her an additional 10 tablets of clonazepam 0.5 mg to take as needed for severe anxiety and restlessness.  The patient will be scheduled for close follow up in 2 weeks or sooner if needed.  I stressed improved glycemic control by taking meds as prescribed.  Check blood glucose 5-7 times per day and prn.  Hypoglycemia precautions reviewed with patient.    FOLLOW UP 2 weeks for recheck   The patient was given clear instructions to go to ER  or return to medical center if symptoms don't improve, worsen or new problems develop.  The patient verbalized understanding.  The patient was told to call to get lab results if they haven't heard anything in the next week.            Cleora Fleet, MD 11/10/12 2136

## 2012-11-10 NOTE — ED Notes (Signed)
Patient states was seen at [facility] Needs medication for her anxiety

## 2012-11-23 ENCOUNTER — Encounter (HOSPITAL_COMMUNITY): Payer: Self-pay | Admitting: Psychiatry

## 2012-11-23 ENCOUNTER — Ambulatory Visit (INDEPENDENT_AMBULATORY_CARE_PROVIDER_SITE_OTHER): Payer: No Typology Code available for payment source | Admitting: Psychiatry

## 2012-11-23 VITALS — BP 152/89 | HR 77 | Ht 61.0 in | Wt 124.0 lb

## 2012-11-23 DIAGNOSIS — F329 Major depressive disorder, single episode, unspecified: Secondary | ICD-10-CM

## 2012-11-23 DIAGNOSIS — F3289 Other specified depressive episodes: Secondary | ICD-10-CM

## 2012-11-23 MED ORDER — CLONAZEPAM 0.5 MG PO TABS: ORAL_TABLET | ORAL | Status: DC

## 2012-11-23 MED ORDER — CITALOPRAM HYDROBROMIDE 10 MG PO TABS: ORAL_TABLET | ORAL | Status: DC

## 2012-11-23 NOTE — Progress Notes (Signed)
Patient ID: Nancy Singh, female   DOB: October 31, 1960, 52 y.o.   MRN: [id number] Chief complaint I have a lot of anxiety.  History of present illness. Patient is a 52 year old single unemployed female who is referred from Dr. Laural Benes for management of her depression.  Patient is a poor historian and she has difficulty explaining her symptoms.  Patient mentioned that she lost her job 3 months ago and since then her anxiety has been increased.  Initially patient told that she lost her job for no reason but later admitted that she was terminated.  She did not provide much information about her job.  She was working as an Public house manager.  Patient admitted some history of anxiety and depression however recently it has been much intense and increased.  She endorses crying spells, racing thoughts, poor sleep, getting scared and fearful.  She also endorsed social isolation and getting overwhelmed.  She has limited social network.  She has a daughter however she does not talk to her other regular basis.  Patient endorsed any suicidal thinking, hallucinations, paranoia or any violence or aggression.  She mentioned that she never liked taking medication however when she saw her physician she was very stressed out and wanted to get some help in medication.  She was prescribed clonazepam 0.5 mg up to twice a day which was given 3 weeks ago however patient is out of her Klonopin.  She was given only 10 tablets.  She admitted Klonopin makes her very sleepy and she is feeling very tired when she took the Klonopin.  Patient is very worried about her future.  She feels sometimes hopeless and helpless but willing to get some help.  At this time she is getting support financially from her friend.  She is not drinking or using any illegal substance.  Patient has multiple physical problems and recently due to stress and feeling overwhelmed her blood sugar has been difficult to control.  Patient endorsed financial distress making her depression  worse.  Past psychiatric history. Patient denies any history of previous psychiatric inpatient treatment or any suicidal attempt.  She endorses history of anxiety and depression for many years but never seen a psychiatrist.  She was given Valium almost 20 years ago which she took at that time.  Patient is very guarded about her past psychiatric history.  She denies any history of mania psychosis or any aggression.  Psychosocial history. Patient was born in [place].  She's been living in a client of the past 20 years.  She has one daughter who lives close by.  She has a brother who lives out of town the patient has very limited contact with him.  Her parents are deceased.  Medical history. Patient has multiple medical problems.  She has diabetes which has been complicated in the past and require multiple hospitalizations for ketoacidosis.  She has a history of psoriasis, anemia, hyperglycemia and history of UTI.  She sees Dr. Galen Daft.  Family history. Patient denies any family history of psychiatric illness.  Alcohol and substance use history. Patient admitted history of drinking in her early age however claims to be sober.  Patient denies any use of illegal substance.  Education work history. Patient has a 2 years of college and she had worked as a Public house manager in the past.  Patient admitted multiple times she was terminated but did not provide much information.  History of the abuse. Patient denies any history of physical sexual or verbal abuse.  Review  of Systems  Constitutional: Positive for weight loss and malaise/fatigue.  Neurological: Positive for weakness.  Psychiatric/Behavioral: Positive for depression and memory loss. The patient is nervous/anxious and has insomnia.    Mental status examination. Patient is fairly groomed and dressed.  She appears to be her stated age.  She is very anxious at times tearful.  She has some thought blocking and appears guarded.  She  described her mood as depressed and anxious and her affect is constricted.  She denies any active or passive suicidal thoughts and homicidal thoughts.  She denies any auditory or visual hallucination.  Her thought process is slow and at times circumstantial.  There were no delusions or paranoia present at this time.  Her attention concentration is poor.  She has difficulty organizing her thoughts.  Her psychomotor activity is slowed.  She has no tremors or shakes.  She is oriented x3.  Her insight judgment and impulse control despair.  Assessment Axis I depressive disorder NOS Axis II deferred Axis III see medical history Axis IV mild to moderate Axis V 55-60  Plan At this time I do believe patient is experiencing increased depression and anxiety.  She would require antidepressant.  After some discussion she agreed to try Celexa which she can afford.  I will start Celexa 10 mg daily and gradually increase to 20 mg after one week.  I explain also was combative person medication especially GI side effects.  I would also provide clonazepam 0.5 mg and recommended to take half tablet only as needed for severe anxiety.  I will provide only 5 pills.  We'll also schedule her for counseling in this office.  I discussed safety plan and recommended to call us if she started to feel worse and the symptom or anytime having suicidal thoughts or homicidal thoughts.  Time spent 60 minutes.  We'll see her again in 2-3 weeks.

## 2012-11-25 ENCOUNTER — Ambulatory Visit (INDEPENDENT_AMBULATORY_CARE_PROVIDER_SITE_OTHER): Payer: No Typology Code available for payment source | Admitting: Psychiatry

## 2012-11-25 DIAGNOSIS — F341 Dysthymic disorder: Secondary | ICD-10-CM

## 2012-11-25 DIAGNOSIS — F329 Major depressive disorder, single episode, unspecified: Secondary | ICD-10-CM

## 2012-11-26 ENCOUNTER — Encounter (HOSPITAL_COMMUNITY): Payer: Self-pay | Admitting: *Deleted

## 2012-11-26 ENCOUNTER — Encounter (HOSPITAL_COMMUNITY): Payer: Self-pay | Admitting: Psychiatry

## 2012-11-26 ENCOUNTER — Emergency Department (INDEPENDENT_AMBULATORY_CARE_PROVIDER_SITE_OTHER)
Admission: EM | Admit: 2012-11-26 | Discharge: 2012-11-26 | Disposition: A | Discharge status: Home Health | Payer: No Typology Code available for payment source | Source: Home / Self Care | Attending: Family Medicine | Admitting: Family Medicine

## 2012-11-26 DIAGNOSIS — E1039 Type 1 diabetes mellitus with other diabetic ophthalmic complication: Secondary | ICD-10-CM

## 2012-11-26 DIAGNOSIS — F329 Major depressive disorder, single episode, unspecified: Secondary | ICD-10-CM

## 2012-11-26 DIAGNOSIS — F419 Anxiety disorder, unspecified: Secondary | ICD-10-CM | POA: Diagnosis present

## 2012-11-26 DIAGNOSIS — E11319 Type 2 diabetes mellitus with unspecified diabetic retinopathy without macular edema: Secondary | ICD-10-CM

## 2012-11-26 DIAGNOSIS — K219 Gastro-esophageal reflux disease without esophagitis: Secondary | ICD-10-CM

## 2012-11-26 DIAGNOSIS — R739 Hyperglycemia, unspecified: Secondary | ICD-10-CM

## 2012-11-26 DIAGNOSIS — E10319 Type 1 diabetes mellitus with unspecified diabetic retinopathy without macular edema: Secondary | ICD-10-CM

## 2012-11-26 DIAGNOSIS — J309 Allergic rhinitis, unspecified: Secondary | ICD-10-CM

## 2012-11-26 MED ORDER — ATORVASTATIN CALCIUM 20 MG PO TABS: 20.0000 mg | ORAL_TABLET | Freq: Every day | ORAL | Status: DC

## 2012-11-26 MED ORDER — LISINOPRIL 10 MG PO TABS: 10.0000 mg | ORAL_TABLET | Freq: Every day | ORAL | Status: DC

## 2012-11-26 MED ORDER — PANTOPRAZOLE SODIUM 40 MG PO TBEC: 40.0000 mg | DELAYED_RELEASE_TABLET | Freq: Every day | ORAL | Status: DC

## 2012-11-26 MED ORDER — CLONAZEPAM 0.5 MG PO TABS: ORAL_TABLET | ORAL | Status: DC

## 2012-11-26 NOTE — ED Notes (Signed)
Follow up appoinment for medications and blood pressure.

## 2012-11-26 NOTE — Progress Notes (Signed)
Patient ID: Nancy Singh, female   DOB: July 18, 1961, 52 y.o.   MRN: [id number] Presenting Problem Chief Complaint:depression and anxiety  What are the main stressors in your life right now, how long? Job loss, homelessness  Previous mental health services Have you ever been treated for a mental health problem, when, where, by whom? No    Are you currently seeing a therapist or counselor, counselor's name? No   Have you ever had a mental health hospitalization, how many times, length of stay? No    Have you ever had suicidal thoughts or attempted suicide, when, how? No   Risk factors for Suicide Demographic factors:  Low socioeconomic status and Unemployed Current mental status: none Loss factors: Financial problems/change in socioeconomic status Historical factors: none Risk Reduction factors: none Clinical factors:  depression Cognitive features that contribute to risk: Polarized thinking    SUICIDE RISK:  Mild:  Suicidal ideation of limited frequency, intensity, duration, and specificity.  There are no identifiable plans, no associated intent, mild dysphoria and related symptoms, good self-control (both objective and subjective assessment), few other risk factors, and identifiable protective factors, including available and accessible social support.  Medical history Medical treatment and/or problems, explain: Yes . Type 1 diabetes Do you have any issues with chronic pain?  No      Current medications: celexa, klonopin Prescribed by: Arfeen   Social/family history Have you been married, how many times?  deferred  Do you have children?  deferred  How many pregnancies have you had?  deferred  Who lives in your current household? Homeless, living out of car and sometimes with friends.  Military history: No   Religious/spiritual involvement:  What religion/faith base are you? Christian  Family of origin (childhood history)   Describe the atmosphere of the  household where you grew up: deferred Do you have siblings, step/half siblings, list names, relation, sex, age? deferred  Are your parents separated/divorced, when and why? deferred  Are your parents alive? deferred  Social supports (personal and professional): some family, but Client did not report family or friends as significant sources of support  Education How many grades have you completed? some college   Employment (financial issues) Unemployed, homeless  Legal history deferred  Trauma/Abuse history: Have you ever been exposed to any form of abuse, what type? deferred  Have you ever been exposed to something traumatic, describe? deferred   Mental Status: General Appearance Nancy Singh:  Disheveled Eye Contact:  Fair Motor Behavior:  Restlestness Speech:  Normal Level of Consciousness:  Alert Mood:  Dysphoric Affect:  Constricted Anxiety Level: moderate Thought Process:  Coherent Thought Content:  WNL Perception:  WNL Judgment:  Fair Insight:  limited Cognition:  wnl  Diagnosis AXIS I Depressive Disorder NOS  AXIS II No diagnosis  AXIS III Past Medical History  Diagnosis Date  . DM type 1 causing eye disease   . Acid reflux   . High cholesterol   . Psoriasis   . Menorrhagia   . Anemia   . Hyperglycemia   . Allergic rhinitis   . Diabetic retinopathy associated with type 1 diabetes mellitus   . GERD (gastroesophageal reflux disease)   . Cachexia     AXIS IV other psychosocial or environmental problems  AXIS V 51-60 moderate symptoms   Summary: Pt. Was emotionally guarded, distrustful about the therapy process. Pt. Lost job and homeless in last year. Discussed feelings related to loss of home, employment, and independence.  _________________________________________  Boneta Lucks, LPC 11-26-12

## 2012-11-26 NOTE — ED Provider Notes (Signed)
History     CSN: [id number]  Arrival date & time 11/26/12  1514   First MD Initiated Contact with Patient 11/26/12 1529      No chief complaint on file.   (Consider location/radiation/quality/duration/timing/severity/associated sxs/prior treatment) HPI Pt reports that she did follow up and get established with psychiatry.  She was started on celexa.  She was given 5 tabs of clonipine but reports that she was not able to get the prescription of the celexa because of cost.    Past Medical History  Diagnosis Date  . DM type 1 causing eye disease   . Acid reflux   . High cholesterol   . Psoriasis   . Menorrhagia   . Anemia   . Hyperglycemia   . Allergic rhinitis   . Diabetic retinopathy associated with type 1 diabetes mellitus   . GERD (gastroesophageal reflux disease)   . Cachexia     History reviewed. No pertinent past surgical history.  No family history on file.  History  Substance Use Topics  . Smoking status: Never Smoker   . Smokeless tobacco: Not on file  . Alcohol Use: No    OB History   Grav Para Term Preterm Abortions TAB SAB Ect Mult Living                  Review of Systems Constitutional: Negative.  HENT: Negative.  Respiratory: Negative.  Cardiovascular: Negative.  Gastrointestinal: Negative.  Endocrine: Negative.  Genitourinary: Negative.  Musculoskeletal: Negative.  Skin: Negative.  Allergic/Immunologic: Negative.  Neurological: Negative.  Hematological: Negative.  Psychiatric/Behavioral: Negative.  All other systems reviewed and are negative   Allergies  Review of patient's allergies indicates no known allergies.  Home Medications   Current Outpatient Rx  Name  Route  Sig  Dispense  Refill  . atorvastatin (LIPITOR) 20 MG tablet   Oral   Take 1 tablet (20 mg total) by mouth daily.   30 tablet   1   . citalopram (CELEXA) 10 MG tablet      Take 1 tab for 1 week and than 2 tab daily   45 tablet   0   . clonazePAM (KLONOPIN)  0.5 MG tablet      Take 1/2 to 1 as needed   5 tablet   0   . ferrous sulfate 325 (65 FE) MG tablet   Oral   Take 1 tablet (325 mg total) by mouth 2 (two) times daily with a meal.   60 tablet   0   . insulin aspart (NOVOLOG) 100 UNIT/ML injection   Subcutaneous   Inject 0-15 Units into the skin 3 (three) times daily with meals. CBG 121 - 150: 2 units,151 - 200: 3 units, 201 - 250: 5 units, 251 - 300: 8 units, 301 - 350: 11 units, 351 - 400: 15 units   1 vial   3   . insulin glargine (LANTUS) 100 UNIT/ML injection   Subcutaneous   Inject 15 Units into the skin daily.   10 mL   3   . lisinopril (PRINIVIL,ZESTRIL) 10 MG tablet   Oral   Take 1 tablet (10 mg total) by mouth daily.   30 tablet   1   . loratadine (CLARITIN) 10 MG tablet   Oral   Take 1 tablet (10 mg total) by mouth daily.         . pantoprazole (PROTONIX) 40 MG tablet   Oral   Take 1 tablet (40 mg total)  by mouth daily.   30 tablet   1     BP 131/77  Pulse 76  Temp(Src) 98.3 F (36.8 C) (Oral)  Resp 20  SpO2 100%  LMP 10/03/2012  Physical Exam Nursing note and vitals reviewed.  Constitutional: She is oriented to person, place, and time. She appears well-developed and well-nourished. No distress.  HENT:  Head: Normocephalic and atraumatic.  Eyes: Conjunctivae and EOM are normal. Pupils are equal, round, and reactive to light.  Neck: Normal range of motion. Neck supple. No JVD present. No tracheal deviation present. No thyromegaly present.  Cardiovascular: Normal rate, regular rhythm and normal heart sounds.  Pulmonary/Chest: Effort normal and breath sounds normal. No respiratory distress. She has no wheezes.  Abdominal: Soft. Bowel sounds are normal.  Musculoskeletal: Normal range of motion. She exhibits no edema and no tenderness.  Lymphadenopathy:  She has no cervical adenopathy.  Neurological: She is alert and oriented to person, place, and time. She has normal reflexes.  Skin: Skin is  warm and dry.  Psychiatric: She has a normal mood and affect. Her behavior is normal. Judgment and thought content normal.    ED Course  Procedures (including critical care time)  Labs Reviewed - No data to display No results found.   No diagnosis found.    MDM  IMPRESSION  Depression and Anxiety Disorder   Type 1 Diabetes  Mellitus  Long-term nonadherence   Hyperglycemia  GERD  Menorrhagia    RECOMMENDATIONS / PLAN Pt is asking for more clonipin.  She is not going to see her psychiatrist for 2 weeks.  Pt says she will get paid in the next 2 weeks. She says that she will use the clonipin to help control her severe anxiety until she can get started on the celexa.    I will give her a few more clonipin tablets but admonished her that she absolutely had to follow the recommendations and treatment plan of her psychiatrist.  The patient verbalized understanding.   Continued close monitoring of BS advised.     FOLLOW UP 3 months  The patient was given clear instructions to go to ER or return to medical center if symptoms don't improve, worsen or new problems develop.  The patient verbalized understanding.  The patient was told to call to get lab results if they haven't heard anything in the next week.            Cleora Fleet, MD 11/26/12 [phone]

## 2012-11-30 ENCOUNTER — Encounter (HOSPITAL_COMMUNITY): Payer: Self-pay | Admitting: Emergency Medicine

## 2012-11-30 ENCOUNTER — Emergency Department (INDEPENDENT_AMBULATORY_CARE_PROVIDER_SITE_OTHER)
Admission: EM | Admit: 2012-11-30 | Discharge: 2012-11-30 | Disposition: A | Discharge status: Home / Self Care | Payer: No Typology Code available for payment source | Source: Home / Self Care | Attending: Emergency Medicine | Admitting: Emergency Medicine

## 2012-11-30 DIAGNOSIS — F419 Anxiety disorder, unspecified: Secondary | ICD-10-CM

## 2012-11-30 DIAGNOSIS — R5381 Other malaise: Secondary | ICD-10-CM

## 2012-11-30 DIAGNOSIS — R42 Dizziness and giddiness: Secondary | ICD-10-CM

## 2012-11-30 LAB — POCT I-STAT, CHEM 8
Calcium, Ion: 1.17 mmol/L (ref 1.12–1.23)
Chloride: 102 mEq/L (ref 96–112)
Glucose, Bld: 156 mg/dL — ABNORMAL HIGH (ref 70–99)
HCT: 37 % (ref 36.0–46.0)
Hemoglobin: 12.6 g/dL (ref 12.0–15.0)
TCO2: 26 mmol/L (ref 0–100)

## 2012-11-30 NOTE — ED Provider Notes (Signed)
Chief Complaint:   Chief Complaint  Patient presents with  . Dizziness    x two days.   . Anxiety    pt states that she is under alot of stress. "doesnt know what going on with my body"    History of Present Illness:   Nancy Singh is a 52 year old female, [facility], with a history of type 1 diabetes who presents tonight with a three-day history of malaise, fatigue, and feeling cold and chilled. She denies any fever or sweats. She's also felt dizzy. She describes spinning of the environment, lightheadedness, and near syncope at times. She's not passed out. She's felt nauseated and vomited and once or twice. She's chronically nervous and anxious. She's been seen both here, at the [facility], and at the [facility] for anxiety. She was placed on Klonopin 0.5 mg twice a day but this makes her drowsy. She is able to tolerate a half a pill twice a day. The [facility] placed her on Lexapro but she's not gotten it filled it. She denies any depression. She has a history of anemia due to heavy menstrual flow. She also has had headaches, some urinary frequency, and heavy menses. Her last menstrual period was about 2 weeks ago. She's on Lantus insulin and NovoLog sliding scale, but she takes these irregularly and not on a regular schedule. She's also on lisinopril, Lipitor, and Protonix. Her blood sugars over the past week have fluctuated widely from 72-402. She denies shortness of breath, coughing, wheezing, chest pain, tightness, pressure, palpitations, syncope, abdominal pain, hematemesis, diarrhea, or neurological symptoms.  Review of Systems:  Other than noted above, the patient denies any of the following symptoms. Systemic:  No fever, chills, sweats, fatigue, myalgias, headache, or anorexia. Eye:  No redness, pain or drainage. ENT:  No earache, nasal congestion, rhinorrhea, sinus pressure, or sore throat. Lungs:  No cough,  sputum production, wheezing, shortness of breath.  Cardiovascular:  No chest pain, palpitations, or syncope. GI:  No nausea, vomiting, abdominal pain or diarrhea. GU:  No dysuria, frequency, or hematuria. Skin:  No rash or pruritis.  PMFSH:  Past medical history, family history, social history, meds, and allergies were reviewed.    Physical Exam:   Vital signs:  BP 150/77  Pulse 81  Temp(Src) 97.5 F (36.4 C) (Oral)  Resp 20  SpO2 98%  LMP 10/31/2012 Filed Vitals:   11/30/12 1911 11/30/12 2013 Supine  11/30/12 2014 Sitting  11/30/12 2015 Standing   BP: 164/88 151/77 148/71 150/77  Pulse: 82 75 77 81  Temp: 97.5 F (36.4 C)     TempSrc: Oral     Resp: 20     SpO2: 98%      General:  Alert, in no distress. Eye:  PERRL, full EOMs.  Lids and conjunctivas were normal. ENT:  TMs and canals were normal, without erythema or inflammation.  Nasal mucosa was clear and uncongested, without drainage.  Mucous membranes were moist.  Pharynx was clear, without exudate or drainage.  There were no oral ulcerations or lesions. Neck:  Supple, no adenopathy, tenderness or mass. Thyroid was normal. Lungs:  No respiratory distress.  Lungs were clear to auscultation, without wheezes, rales or rhonchi.  Breath sounds were clear and equal bilaterally. Heart:  Regular rhythm, without gallops, murmers or rubs. Abdomen:  Soft, flat, and non-tender to palpation.  No hepatosplenomagaly or mass. Neurological examination: The patient is alert and  oriented x3. Speech is clear, fluent, and appropriate. Cranial nerves are intact. There is no pronator drift and finger to nose was normal. Muscle strength, sensation, and DTRs are normal. Babinskis are downgoing. Station and gait were normal. Romberg sign is negative, patient is able to perform tandem gait well. Dix-Hallpike maneuver was negative. Skin:  Clear, warm, and dry, without rash or lesions.  Labs:   Results for orders placed during the hospital encounter  of 11/30/12  POCT I-STAT, CHEM 8      Result Value Range   Sodium 138  135 - 145 mEq/L   Potassium 4.0  3.5 - 5.1 mEq/L   Chloride 102  96 - 112 mEq/L   BUN 12  6 - 23 mg/dL   Creatinine, Ser 3.08  0.50 - 1.10 mg/dL   Glucose, Bld 657 (*) 70 - 99 mg/dL   Calcium, Ion 8.46  9.62 - 1.23 mmol/L   TCO2 26  0 - 100 mmol/L   Hemoglobin 12.6  12.0 - 15.0 g/dL   HCT 95.2  84.1 - 32.4 %     Date: 11/30/2012  Rate: 77  Rhythm: normal sinus rhythm  QRS Axis: normal  Intervals: normal  ST/T Wave abnormalities: normal  Conduction Disutrbances:none  Narrative Interpretation: Normal sinus rhythm, normal EKG.  Old EKG Reviewed: none available  Assessment:  The primary encounter diagnosis was Dizziness. Diagnoses of Malaise and fatigue and Anxiety and depression were also pertinent to this visit.  All of her labs and EKG looked good and. I think the dizziness and malaise and fatigue are mostly on the basis of anxiety and possibly mild depression. I have urged her to go ahead and start the Lexapro. I suggested she take one half tablet of Klonopin twice daily on a regular basis if this doesn't make her too drowsy. I also suggested that [facility] in [facility]. It does not sound like she's been very compliant with her diabetes regimen, and I suggested she take just her Lantus, 15 units, at bedtime.  Plan:   1.  The following meds were prescribed:   Discharge Medication List as of 11/30/2012  8:20 PM     2.  The patient was instructed in symptomatic care and handouts were given. 3.  The patient was told to return if becoming worse in any way, if no better in 3 or 4 days, and given some red flag symptoms such as chest pain, difficulty breathing, or syncope that would indicate earlier return.    Reuben Likes, MD 11/30/12 [phone]

## 2012-11-30 NOTE — ED Notes (Signed)
Reports: feeling dizzy.  Under a lot stress lately. Hx of anxiety.   "dont know what's going on with my body"  Pt denies any other symptoms

## 2012-12-02 ENCOUNTER — Ambulatory Visit (HOSPITAL_COMMUNITY): Payer: Self-pay | Admitting: Psychiatry

## 2012-12-15 ENCOUNTER — Ambulatory Visit (INDEPENDENT_AMBULATORY_CARE_PROVIDER_SITE_OTHER): Payer: No Typology Code available for payment source | Admitting: Psychiatry

## 2012-12-15 ENCOUNTER — Encounter (HOSPITAL_COMMUNITY): Payer: Self-pay | Admitting: Psychiatry

## 2012-12-15 VITALS — BP 143/85 | HR 100 | Ht 61.0 in | Wt 122.4 lb

## 2012-12-15 DIAGNOSIS — F329 Major depressive disorder, single episode, unspecified: Secondary | ICD-10-CM

## 2012-12-15 DIAGNOSIS — F3289 Other specified depressive episodes: Secondary | ICD-10-CM
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
## 2013-01-20 ENCOUNTER — Ambulatory Visit (INDEPENDENT_AMBULATORY_CARE_PROVIDER_SITE_OTHER): Payer: No Typology Code available for payment source | Admitting: Psychiatry

## 2013-01-20 DIAGNOSIS — F341 Dysthymic disorder: Secondary | ICD-10-CM

## 2013-01-21 ENCOUNTER — Encounter (HOSPITAL_COMMUNITY): Payer: Self-pay | Admitting: Psychiatry

## 2013-01-21 NOTE — Progress Notes (Signed)
   THERAPIST PROGRESS NOTE  Session Time: 1:00-1:50  Participation Level: Active  Behavioral Response: CasualAlertAnxious  Type of Therapy: Individual Therapy  Treatment Goals addressed: emotion regulation, management of anxiety  Interventions: CBT  Summary: Nancy Singh is a 52 y.o. female who presents with depression and anxiety.   Suicidal/Homicidal: Nowithout intent/plan  Therapist Response: Pt. Presented as guarded, questioned details of diagnosis, curiosity about how she was referred to psychiatric appointment. Discussion focused on themes related to communication problems in relationships with family members, potential supports and health care providers. Therapist shared personal experience of Pt. As demonstrating distrust with the therapeutic relationship and feeling the need to walk on eggshells in order to develop the relationship. Explored how this pattern may show of in other relationships and interfere with Pt.'s ability to ask for help and seeking support in getting needs met. Pt. Was receptive to feedback.  Plan: Return again in 2-3 weeks.  Diagnosis: Axis I: Anxiety Disorder NOS    Axis II: No diagnosis    Wynonia Musty 01/21/2013

## 2013-02-02 ENCOUNTER — Ambulatory Visit (HOSPITAL_COMMUNITY): Payer: Self-pay | Admitting: Psychiatry

## 2013-02-03 ENCOUNTER — Ambulatory Visit (INDEPENDENT_AMBULATORY_CARE_PROVIDER_SITE_OTHER): Payer: No Typology Code available for payment source | Admitting: Psychiatry

## 2013-02-03 ENCOUNTER — Encounter (HOSPITAL_COMMUNITY): Payer: Self-pay | Admitting: Psychiatry

## 2013-02-03 VITALS — BP 142/89 | HR 96 | Ht 61.0 in | Wt 121.2 lb

## 2013-02-03 DIAGNOSIS — F3289 Other specified depressive episodes: Secondary | ICD-10-CM

## 2013-02-03 DIAGNOSIS — F329 Major depressive disorder, single episode, unspecified: Secondary | ICD-10-CM

## 2013-02-03 MED ORDER — FLUOXETINE HCL 10 MG PO CAPS: ORAL_CAPSULE | ORAL | Status: DC
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
## 2013-03-02 ENCOUNTER — Encounter (HOSPITAL_COMMUNITY): Payer: Self-pay | Admitting: Psychiatry

## 2013-03-02 ENCOUNTER — Ambulatory Visit (INDEPENDENT_AMBULATORY_CARE_PROVIDER_SITE_OTHER): Payer: No Typology Code available for payment source | Admitting: Psychiatry

## 2013-03-02 VITALS — BP 119/70 | HR 93 | Ht 60.24 in | Wt 122.6 lb

## 2013-03-02 DIAGNOSIS — F329 Major depressive disorder, single episode, unspecified: Secondary | ICD-10-CM

## 2013-03-02 MED ORDER — FLUOXETINE HCL 20 MG PO CAPS: ORAL_CAPSULE | ORAL | Status: DC
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
## 2013-03-10 ENCOUNTER — Ambulatory Visit: Payer: No Typology Code available for payment source | Attending: Family Medicine | Admitting: Internal Medicine

## 2013-03-10 ENCOUNTER — Encounter: Payer: Self-pay | Admitting: Internal Medicine

## 2013-03-10 VITALS — BP 131/83 | HR 92 | Temp 98.7°F | Resp 16 | Ht 61.0 in | Wt 125.6 lb

## 2013-03-10 DIAGNOSIS — E1065 Type 1 diabetes mellitus with hyperglycemia: Secondary | ICD-10-CM | POA: Insufficient documentation

## 2013-03-10 DIAGNOSIS — K219 Gastro-esophageal reflux disease without esophagitis: Secondary | ICD-10-CM | POA: Insufficient documentation

## 2013-03-10 DIAGNOSIS — E11319 Type 2 diabetes mellitus with unspecified diabetic retinopathy without macular edema: Secondary | ICD-10-CM | POA: Insufficient documentation

## 2013-03-10 DIAGNOSIS — E119 Type 2 diabetes mellitus without complications: Secondary | ICD-10-CM

## 2013-03-10 DIAGNOSIS — J3489 Other specified disorders of nose and nasal sinuses: Secondary | ICD-10-CM | POA: Insufficient documentation

## 2013-03-10 DIAGNOSIS — R141 Gas pain: Secondary | ICD-10-CM | POA: Insufficient documentation

## 2013-03-10 DIAGNOSIS — F329 Major depressive disorder, single episode, unspecified: Secondary | ICD-10-CM

## 2013-03-10 DIAGNOSIS — F341 Dysthymic disorder: Secondary | ICD-10-CM

## 2013-03-10 DIAGNOSIS — Z794 Long term (current) use of insulin: Secondary | ICD-10-CM | POA: Insufficient documentation

## 2013-03-10 DIAGNOSIS — R142 Eructation: Secondary | ICD-10-CM | POA: Insufficient documentation

## 2013-03-10 DIAGNOSIS — IMO0002 Reserved for concepts with insufficient information to code with codable children: Secondary | ICD-10-CM

## 2013-03-10 DIAGNOSIS — J309 Allergic rhinitis, unspecified: Secondary | ICD-10-CM | POA: Insufficient documentation

## 2013-03-10 DIAGNOSIS — E1039 Type 1 diabetes mellitus with other diabetic ophthalmic complication: Secondary | ICD-10-CM | POA: Insufficient documentation

## 2013-03-10 MED ORDER — LORATADINE 10 MG PO TABS: 10.0000 mg | ORAL_TABLET | Freq: Every day | ORAL | Status: DC

## 2013-03-10 MED ORDER — FERROUS SULFATE 325 (65 FE) MG PO TABS: 325.0000 mg | ORAL_TABLET | Freq: Two times a day (BID) | ORAL | Status: AC

## 2013-03-10 MED ORDER — LISINOPRIL 10 MG PO TABS: 10.0000 mg | ORAL_TABLET | Freq: Every day | ORAL | Status: DC

## 2013-03-10 MED ORDER — ATORVASTATIN CALCIUM 20 MG PO TABS: 20.0000 mg | ORAL_TABLET | Freq: Every day | ORAL | Status: DC

## 2013-03-10 MED ORDER — PANTOPRAZOLE SODIUM 40 MG PO TBEC: 40.0000 mg | DELAYED_RELEASE_TABLET | Freq: Every day | ORAL | Status: AC

## 2013-03-10 MED ORDER — INSULIN GLARGINE 100 UNIT/ML ~~LOC~~ SOLN: 12.0000 [IU] | Freq: Every day | SUBCUTANEOUS | Status: AC

## 2013-03-10 MED ORDER — GUAIFENESIN ER 600 MG PO TB12: 600.0000 mg | ORAL_TABLET | Freq: Two times a day (BID) | ORAL | Status: DC

## 2013-03-10 MED ORDER — SIMETHICONE 80 MG PO CHEW: 80.0000 mg | CHEWABLE_TABLET | Freq: Four times a day (QID) | ORAL | Status: DC | PRN

## 2013-03-10 MED ORDER — INSULIN ASPART 100 UNIT/ML ~~LOC~~ SOLN: 0.0000 [IU] | Freq: Three times a day (TID) | SUBCUTANEOUS | Status: DC

## 2013-03-10 NOTE — Progress Notes (Signed)
Quick Note:  Please inform patient that her A1c was 9.3% which is indicative of poorly controlled diabetes mellitus. Pt is at high risk for acute and chronic complications of poorly controlled diabetes mellitus.   Rodney Langton, MD, CDE, FAAFP [facility] [place], [place]   ______

## 2013-03-10 NOTE — Progress Notes (Signed)
Patient ID: Nancy Singh, female   DOB: Jul 09, 1961, 52 y.o.   MRN: [id number] Patient Demographics  Nancy Singh, is a 52 y.o. female  [id number]  [id number]  DOB - 1960-10-30  Chief Complaint  Patient presents with  . Follow-up    MED REFILL,  . Anorexia    X 3MNTHS/FEELING BLOATED  . Nasal Congestion    COLD SX        Subjective:   Nancy Singh today is here for a follow up visit. Patient has No headache, No chest pain, No abdominal pain - No Nausea, No new weakness tingling or numbness, No Cough - SOB.  The patient reports having difficulty controlling her blood sugars, BS's sometimes they're high in 200's, other times low in 50's.   On further questioning, patient reports that she does not have a good appetite and does not eat well. She reports some flatulence and feels full. On further questioning patient has significant depression and is following Dr. Lolly Mustache at behavioral health. Per patient, since she got started on Prozac she has started feeling somewhat better.  Per patient, she takes Lantus 15 units, in the morning along with sliding scale insulin with meals. She does not eat very good, then reports her blood sugars lower in 50s at night causing hypoglycemia. Around midnight-12:30 am, she feels hungry and drinks Glucerna subsequently causes her blood sugars to go high up in the morning.    She did not take her Lantus this morning.  Objective:    Filed Vitals:   03/10/13 1214  BP: 131/83  Pulse: 92  Temp: 98.7 F (37.1 C)  TempSrc: Oral  Resp: 16  Height: 5\' 1"  (1.549 m)  Weight: 125 lb 9.6 oz (56.972 kg)  SpO2: 100%     ALLERGIES:  No Known Allergies  PAST MEDICAL HISTORY: Past Medical History  Diagnosis Date  . DM type 1 causing eye disease   . Acid reflux   . High cholesterol   . Psoriasis   . Menorrhagia   . Anemia   . Hyperglycemia   . Allergic rhinitis   . Diabetic retinopathy associated with type 1 diabetes mellitus   . GERD  (gastroesophageal reflux disease)   . Cachexia     MEDICATIONS AT HOME: Prior to Admission medications   Medication Sig Start Date End Date Taking? Authorizing Provider  atorvastatin (LIPITOR) 20 MG tablet Take 1 tablet (20 mg total) by mouth daily. 03/10/13  Yes Miana Politte Jenna Luo, MD  ferrous sulfate 325 (65 FE) MG tablet Take 1 tablet (325 mg total) by mouth 2 (two) times daily with a meal. 03/10/13 03/10/14 Yes Jaeven Wanzer K Damere Brandenburg, MD  FLUoxetine (PROZAC) 20 MG capsule Take 1 capsule daily 03/02/13  Yes Cleotis Nipper, MD  insulin aspart (NOVOLOG) 100 UNIT/ML injection Inject 0-15 Units into the skin 3 (three) times daily with meals. CBG 121 - 150: 2 units,151 - 200: 3 units, 201 - 250: 5 units, 251 - 300: 8 units, 301 - 350: 11 units, 351 - 400: 15 units 03/10/13 03/10/14 Yes Kadelyn Dimascio K Keyuna Cuthrell, MD  insulin glargine (LANTUS) 100 UNIT/ML injection Inject 0.12 mLs (12 Units total) into the skin daily. 03/10/13 03/10/14 Yes Erhardt Dada Jenna Luo, MD  lisinopril (PRINIVIL,ZESTRIL) 10 MG tablet Take 1 tablet (10 mg total) by mouth daily. 03/10/13  Yes Charika Mikelson Jenna Luo, MD  pantoprazole (PROTONIX) 40 MG tablet Take 1 tablet (40 mg total) by mouth daily. 03/10/13  Yes Bertrice Leder Jenna Luo, MD  guaiFENesin [facility])  600 MG 12 hr tablet Take 1 tablet (600 mg total) by mouth 2 (two) times daily. For congestion 03/10/13   Zamere Pasternak Jenna Luo, MD  loratadine (CLARITIN) 10 MG tablet Take 1 tablet (10 mg total) by mouth daily. 03/10/13 03/10/14  Eldra Word Jenna Luo, MD  simethicone (GAS-X) 80 MG chewable tablet Chew 1 tablet (80 mg total) by mouth every 6 (six) hours as needed for flatulence. 03/10/13   Hy Swiatek Jenna Luo, MD     Exam  General appearance :Awake, alert, NAD, Speech Clear.  HEENT: Atraumatic and Normocephalic, PERLA Neck: supple, no JVD. No cervical lymphadenopathy.  Chest: Clear to auscultation bilaterally, no wheezing, rales or rhonchi CVS: S1 S2 regular, no murmurs.  Abdomen: soft, NBS, NT, ND, no gaurding, rigidity or  rebound. Extremities: no cyanosis or clubbing, B/L Lower Ext shows no edema Neurology: Awake alert, and oriented X 3, CN II-XII intact, Non focal Skin: No Rash or lesions Wounds:N/A    Data Review   Basic Metabolic Panel: No results found for this basename: NA, K, CL, CO2, GLUCOSE, BUN, CREATININE, CALCIUM, MG, PHOS,  in the last 168 hours Liver Function Tests: No results found for this basename: AST, ALT, ALKPHOS, BILITOT, PROT, ALBUMIN,  in the last 168 hours  CBC: No results found for this basename: WBC, NEUTROABS, HGB, HCT, MCV, PLT,  in the last 168 hours  ------------------------------------------------------------------------------------------------------------------  Recent Labs  03/10/13 1225  HGBA1C 9.3   ------------------------------------------------------------------------------------------------------------------ No results found for this basename: CHOL, HDL, LDLCALC, TRIG, CHOLHDL, LDLDIRECT,  in the last 72 hours ------------------------------------------------------------------------------------------------------------------ No results found for this basename: TSH, T4TOTAL, FREET3, T3FREE, THYROIDAB,  in the last 72 hours ------------------------------------------------------------------------------------------------------------------ No results found for this basename: VITAMINB12, FOLATE, FERRITIN, TIBC, IRON, RETICCTPCT,  in the last 72 hours  Coagulation profile  No results found for this basename: INR, PROTIME,  in the last 168 hours    Assessment & Plan   Active Problems: Uncontrolled diabetes with hypo-and hyperglycemia episodes: Likely due to her poor eating habits, depression. Then she takes Lantus and has hypoglycemia, around midnight she drinks nutritional supplements and have high blood sugars in the morning. - Explained to the patient to eat better, she is working very closely with behavioral health for her depression. I have temporally decrease  Lantus to 12 units. Explained to the patient to have nutritional supplements for diabetic patients, not the regular Glucerna.  - Continue sliding scale insulin with her meals  Flatulence: added simethicone  Allergic rhinitis and congestion: Added Claritin and micinex  Follow-up in one month to recheck blood sugars, may need to increase Lantus back if she is eating better.    Aristide Waggle M.D. 03/10/2013, 1:31 PM

## 2013-03-10 NOTE — Progress Notes (Signed)
HERE FOR RX REFILLS C/O PROBLEMS WITH EQUILIBRIUM X 3 MNTHS CHEST CONGESTION POOR APPETITE/FEELING BLOATED X JAUNDICE NOTED CBG THIS AM 201 UNSURE OF LAST A1C  C/O BILAT LEG PAIN

## 2013-03-11 ENCOUNTER — Telehealth: Payer: Self-pay

## 2013-03-11 NOTE — Telephone Encounter (Signed)
Spoke with patient about lab results Patient stated she will work on her diet to get her DM Under better control

## 2013-03-11 NOTE — Telephone Encounter (Signed)
Message copied by Lestine Mount on Thu Mar 11, 2013 10:11 AM ------      Message from: Cleora Fleet      Created: Wed Mar 10, 2013 10:03 PM       Please inform patient that her A1c was 9.3% which is indicative of poorly controlled diabetes mellitus.  Pt is at high risk for acute and chronic complications of poorly controlled diabetes mellitus.             Rodney Langton, MD, CDE, FAAFP      [facility]      [facility]      [place], [place]        ------

## 2013-03-15 ENCOUNTER — Other Ambulatory Visit: Payer: Self-pay | Admitting: Family Medicine

## 2013-03-15 MED ORDER — DESLORATADINE 5 MG PO TABS: 5.0000 mg | ORAL_TABLET | Freq: Every day | ORAL | Status: DC

## 2013-03-15 MED ORDER — ROSUVASTATIN CALCIUM 10 MG PO TABS: 10.0000 mg | ORAL_TABLET | Freq: Every day | ORAL | Status: AC

## 2013-03-15 NOTE — Telephone Encounter (Signed)
Received a fax from the [facility] health Department program requesting to change the atorvastatin and loratadine to Clarinex and Crestor which patient would be able to receive for free.  I reviewed the patient's medical chart and records and may be changes and sent a fax for the new prescriptions to the health department.  Rodney Langton, MD, CDE, FAAFP [facility] [place], [place]

## 2013-03-16 ENCOUNTER — Encounter: Payer: Self-pay | Admitting: Family Medicine

## 2013-03-16 ENCOUNTER — Ambulatory Visit: Payer: No Typology Code available for payment source | Attending: Family Medicine | Admitting: Family Medicine

## 2013-03-16 DIAGNOSIS — J309 Allergic rhinitis, unspecified: Secondary | ICD-10-CM

## 2013-03-16 DIAGNOSIS — D539 Nutritional anemia, unspecified: Secondary | ICD-10-CM

## 2013-03-16 DIAGNOSIS — E1110 Type 2 diabetes mellitus with ketoacidosis without coma: Secondary | ICD-10-CM

## 2013-03-16 DIAGNOSIS — E1310 Other specified diabetes mellitus with ketoacidosis without coma: Secondary | ICD-10-CM

## 2013-03-16 DIAGNOSIS — E162 Hypoglycemia, unspecified: Secondary | ICD-10-CM

## 2013-03-16 DIAGNOSIS — IMO0002 Reserved for concepts with insufficient information to code with codable children: Secondary | ICD-10-CM

## 2013-03-16 DIAGNOSIS — E1039 Type 1 diabetes mellitus with other diabetic ophthalmic complication: Secondary | ICD-10-CM

## 2013-03-16 DIAGNOSIS — E11319 Type 2 diabetes mellitus with unspecified diabetic retinopathy without macular edema: Secondary | ICD-10-CM

## 2013-03-16 DIAGNOSIS — R2681 Unsteadiness on feet: Secondary | ICD-10-CM | POA: Insufficient documentation

## 2013-03-16 DIAGNOSIS — E1065 Type 1 diabetes mellitus with hyperglycemia: Secondary | ICD-10-CM

## 2013-03-16 DIAGNOSIS — R42 Dizziness and giddiness: Secondary | ICD-10-CM

## 2013-03-16 DIAGNOSIS — F341 Dysthymic disorder: Secondary | ICD-10-CM

## 2013-03-16 DIAGNOSIS — I10 Essential (primary) hypertension: Secondary | ICD-10-CM

## 2013-03-16 DIAGNOSIS — R269 Unspecified abnormalities of gait and mobility: Secondary | ICD-10-CM

## 2013-03-16 NOTE — Progress Notes (Signed)
Patient ID: Nancy Singh, female   DOB: 05-10-1961, 52 y.o.   MRN: [id number]  CC:  Uncoordinated gait   HPI: Pt reports that for the past 6 months she has been having episodes where she gets up out of bed and feels like her legs are uncoordinated and "crooked" and reports that these episodes have been happening for a while.  She says that she is not drinking a lot of alcohol any longer.  She says an Technical sales engineer stopped her and accused her of drinking because she was walking with an unsteady gait but she was not drinking.  She says this mostly happens in the mornings and then resolves for the rest of the day.  She says that it is not related to her blood sugar but says that she does consistenly have low blood sugars in the early morning hours.  Pt has sought care and followed up with psychiatry and being treated for depression.   No Known Allergies Past Medical History  Diagnosis Date  . DM type 1 causing eye disease   . Acid reflux   . High cholesterol   . Psoriasis   . Menorrhagia   . Anemia   . Hyperglycemia   . Allergic rhinitis   . Diabetic retinopathy associated with type 1 diabetes mellitus   . GERD (gastroesophageal reflux disease)   . Cachexia    Current Outpatient Prescriptions on File Prior to Visit  Medication Sig Dispense Refill  . desloratadine (CLARINEX) 5 MG tablet Take 1 tablet (5 mg total) by mouth daily.  30 tablet  3  . ferrous sulfate 325 (65 FE) MG tablet Take 1 tablet (325 mg total) by mouth 2 (two) times daily with a meal.  60 tablet  3  . FLUoxetine (PROZAC) 20 MG capsule Take 1 capsule daily  30 capsule  0  . guaiFENesin (MUCINEX) 600 MG 12 hr tablet Take 1 tablet (600 mg total) by mouth 2 (two) times daily. For congestion  30 tablet  0  . insulin aspart (NOVOLOG) 100 UNIT/ML injection Inject 0-15 Units into the skin 3 (three) times daily with meals. CBG 121 - 150: 2 units,151 - 200: 3 units, 201 - 250: 5 units, 251 - 300: 8 units, 301 - 350: 11 units, 351 - 400:  15 units  1 vial  3  . insulin glargine (LANTUS) 100 UNIT/ML injection Inject 0.12 mLs (12 Units total) into the skin daily.  10 mL  3  . lisinopril (PRINIVIL,ZESTRIL) 10 MG tablet Take 1 tablet (10 mg total) by mouth daily.  30 tablet  2  . pantoprazole (PROTONIX) 40 MG tablet Take 1 tablet (40 mg total) by mouth daily.  30 tablet  3  . rosuvastatin (CRESTOR) 10 MG tablet Take 1 tablet (10 mg total) by mouth daily.  30 tablet  2  . simethicone (GAS-X) 80 MG chewable tablet Chew 1 tablet (80 mg total) by mouth every 6 (six) hours as needed for flatulence.  30 tablet  0   No current facility-administered medications on file prior to visit.   No family history on file. History   Social History  . Marital Status: Single    Spouse Name: N/A    Number of Children: N/A  . Years of Education: N/A   Occupational History  . Not on file.   Social History Main Topics  . Smoking status: Never Smoker   . Smokeless tobacco: Not on file  . Alcohol Use: No  . Drug  Use: No  . Sexually Active: No   Other Topics Concern  . Not on file   Social History Narrative  . No narrative on file    Review of Systems  Constitutional: Negative for fever, chills, diaphoresis, activity change, appetite change and fatigue.  HENT: Negative for ear pain, nosebleeds, congestion, facial swelling, rhinorrhea, neck pain, neck stiffness and ear discharge.   Eyes: Negative for pain, discharge, redness, itching and visual disturbance.  Respiratory: Negative for cough, choking, chest tightness, shortness of breath, wheezing and stridor.   Cardiovascular: Negative for chest pain, palpitations and leg swelling.  Gastrointestinal: Negative for abdominal distention.  Genitourinary: Negative for dysuria, urgency, frequency, hematuria, flank pain, decreased urine volume, difficulty urinating and dyspareunia.  Musculoskeletal: Negative for back pain, joint swelling, arthralgias and gait problem.  Neurological: Negative for  dizziness, tremors, seizures, syncope, facial asymmetry, speech difficulty, weakness, light-headedness, numbness and headaches.  Hematological: Negative for adenopathy. Does not bruise/bleed easily.  Psychiatric/Behavioral: Negative for hallucinations, behavioral problems, confusion, dysphoric mood, decreased concentration and agitation.    Objective:   Filed Vitals:   03/16/13 1703  BP: 155/80  Pulse: 82  Temp: 98.5 F (36.9 C)  Resp: 15    Physical Exam  Constitutional: Appears well-developed and well-nourished. No distress.  HENT: Normocephalic. External right and left ear normal. Oropharynx is clear and moist.  Eyes: Conjunctivae and EOM are normal. PERRLA, no scleral icterus.  Neck: Normal ROM. Neck supple. No JVD. No tracheal deviation. No thyromegaly.  CVS: RRR, S1/S2 +, no murmurs, no gallops, no carotid bruit.  Pulmonary: Effort and breath sounds normal, no stridor, rhonchi, wheezes, rales.  Abdominal: Soft. BS +,  no distension, tenderness, rebound or guarding.  Musculoskeletal: Normal range of motion. No edema and no tenderness.  Lymphadenopathy: No lymphadenopathy noted, cervical, inguinal. Neuro: Alert. Normal reflexes, muscle tone coordination. No cranial nerve deficit. Skin: Skin is warm and dry. No rash noted. Not diaphoretic. No erythema. No pallor.  Psychiatric: Flat affect. Behavior, judgment, thought content normal.   Lab Results  Component Value Date   WBC 8.1 09/17/2012   HGB 12.6 11/30/2012   HCT 37.0 11/30/2012   MCV 83.4 09/17/2012   PLT 294 09/17/2012   Lab Results  Component Value Date   CREATININE 0.90 11/30/2012   BUN 12 11/30/2012   NA 138 11/30/2012   K 4.0 11/30/2012   CL 102 11/30/2012   CO2 29 09/17/2012    Lab Results  Component Value Date   HGBA1C 9.3 03/10/2013   Lipid Panel     Component Value Date/Time   CHOL 314* 09/17/2012 1534   TRIG 90 09/17/2012 1534   HDL 98 09/17/2012 1534   CHOLHDL 3.2 09/17/2012 1534   VLDL 18 09/17/2012 1534    LDLCALC 198* 09/17/2012 1534       Assessment and plan:   Patient Active Problem List   Diagnosis Date Noted  . Anxiety and depression 11/26/2012  . Vagina, candidiasis 01/27/2012  . DM type 1 causing eye disease   . Diabetic retinopathy associated with type 1 diabetes mellitus   . Allergic rhinitis   . Hyperglycemia   . Cachexia   . GERD (gastroesophageal reflux disease)   . DM, UNCOMPLICATED, TYPE I, UNCONTROLLED 02/13/2007  . ANEMIA, DEFICIENCY NOS 02/13/2007  . ANXIETY DEPRESSION 02/13/2007  . ABUSE, ALCOHOL, EPISODIC 02/13/2007  . TOBACCO USER 02/13/2007  . HYPERTENSION, BENIGN ESSENTIAL 02/13/2007  . PSORIASIS 02/13/2007  . HX, PERSONAL, PAST NONCOMPLIANCE 02/13/2007   Recommended that  patient have a neurology referral for further evaluation. Check B12 level.    Pt was advised to decrease the supper time dose of novolog insulin by 50% to avoid early morning hypoglycemia.  Pt was advised to test blood glucose 5x per day and also check some 3am blood sugars.  Alcohol abstinence recommended.    RTC in 1 month  The patient was given clear instructions to go to ER or return to medical center if symptoms don't improve, worsen or new problems develop.  The patient verbalized understanding.  The patient was told to call to get any lab results if not heard anything in the next week.    Rodney Langton, MD, CDE, FAAFP [facility] [place], [place]

## 2013-03-16 NOTE — Progress Notes (Signed)
Patient here because she does not feel well Looses her  Balance Stated this is her third time here for the same thing

## 2013-03-16 NOTE — Patient Instructions (Addendum)
Blood Sugar Monitoring, Adult GLUCOSE METERS FOR SELF-MONITORING OF BLOOD GLUCOSE  It is important to be able to correctly measure your blood sugar (glucose). You can use a blood glucose monitor (a small battery-operated device) to check your glucose level at any time. This allows you and your caregiver to monitor your diabetes and to determine how well your treatment plan is working. The process of monitoring your blood glucose with a glucose meter is called self-monitoring of blood glucose (SMBG). When people with diabetes control their blood sugar, they have better health. To test for glucose with a typical glucose meter, place the disposable strip in the meter. Then place a small sample of blood on the "test strip." The test strip is coated with chemicals that combine with glucose in blood. The meter measures how much glucose is present. The meter displays the glucose level as a number. Several new models can record and store a number of test results. Some models can connect to personal computers to store test results or print them out.  Newer meters are often easier to use than older models. Some meters allow you to get blood from places other than your fingertip. Some new models have automatic timing, error codes, signals, or barcode readers to help with proper adjustment (calibration). Some meters have a large display screen or spoken instructions for people with visual impairments.  INSTRUCTIONS FOR USING GLUCOSE METERS  Wash your hands with soap and warm water, or clean the area with alcohol. Dry your hands completely.  Prick the side of your fingertip with a lancet (a sharp-pointed tool used by hand).  Hold the hand down and gently milk the finger until a small drop of blood appears. Catch the blood with the test strip.  Follow the instructions for inserting the test strip and using the SMBG meter. Most meters require the meter to be turned on and the test strip to be inserted before applying  the blood sample.  Record the test result.  Read the instructions carefully for both the meter and the test strips that go with it. Meter instructions are found in the user manual. Keep this manual to help you solve any problems that may arise. Many meters use "error codes" when there is a problem with the meter, the test strip, or the blood sample on the strip. You will need the manual to understand these error codes and fix the problem.  New devices are available such as laser lancets and meters that can test blood taken from "alternative sites" of the body, other than fingertips. However, you should use standard fingertip testing if your glucose changes rapidly. Also, use standard testing if:  You have eaten, exercised, or taken insulin in the past 2 hours.  You think your glucose is low.  You tend to not feel symptoms of low blood glucose (hypoglycemia).  You are ill or under stress.  Clean the meter as directed by the manufacturer.  Test the meter for accuracy as directed by the manufacturer.  Take your meter with you to your caregiver's office. This way, you can test your glucose in front of your caregiver to make sure you are using the meter correctly. Your caregiver can also take a sample of blood to test using a routine lab method. If values on the glucose meter are close to the lab results, you and your caregiver will see that your meter is working well and you are using good technique. Your caregiver will advise you about what   to do if the results do not match. FREQUENCY OF TESTING  Your caregiver will tell you how often you should check your blood glucose. This will depend on your type of diabetes, your current level of diabetes control, and your types of medicines. The following are general guidelines, but your care plan may be different. Record all your readings and the time of day you took them for review with your caregiver.   Diabetes type 1.  When you are using insulin  with good diabetic control (either multiple daily injections or via a pump), you should check your glucose 4 times a day.  If your diabetes is not well controlled, you may need to monitor more frequently, including before meals and 2 hours after meals, at bedtime, and occasionally between 2 a.m. and 3 a.m.  You should always check your glucose before a dose of insulin or before changing the rate on your insulin pump.  Diabetes type 2.  Guidelines for SMBG in diabetes type 2 are not as well defined.  If you are on insulin, follow the guidelines above.  If you are on medicines, but not insulin, and your glucose is not well controlled, you should test at least twice daily.  If you are not on insulin, and your diabetes is controlled with medicines or diet alone, you should test at least once daily, usually before breakfast.  A weekly profile will help your caregiver advise you on your care plan. The week before your visit, check your glucose before a meal and 2 hours after a meal at least daily. You may want to test before and after a different meal each day so you and your caregiver can tell how well controlled your blood sugars are throughout the course of a 24 hour period.  Gestational diabetes (diabetes during pregnancy).  Frequent testing is often necessary. Accurate timing is important.  If you are not on insulin, check your glucose 4 times a day. Check it before breakfast and 1 hour after the start of each meal.  If you are on insulin, check your glucose 6 times a day. Check it before each meal and 1 hour after the first bite of each meal.  General guidelines.  More frequent testing is required at the start of insulin treatment. Your caregiver will instruct you.  Test your glucose any time you suspect you have low blood sugar (hypoglycemia).  You should test more often when you change medicines, when you have unusual stress or illness, or in other unusual circumstances. OTHER  THINGS TO KNOW ABOUT GLUCOSE METERS  Measurement Range. Most glucose meters are able to read glucose levels over a broad range of values from as low as 0 to as high as 600 mg/dL. If you get an extremely high or low reading from your meter, you should first confirm it with another reading. Report very high or very low readings to your caregiver.  Whole Blood Glucose versus Plasma Glucose. Some older home glucose meters measure glucose in your whole blood. In a lab or when using some newer home glucose meters, the glucose is measured in your plasma (one component of blood). The difference can be important. It is important for you and your caregiver to know whether your meter gives its results as "whole blood equivalent" or "plasma equivalent."  Display of High and Low Glucose Values. Part of learning how to operate a meter is understanding what the meter results mean. Know how high and low glucose concentrations are displayed  on your meter.  Factors that Affect Glucose Meter Performance. The accuracy of your test results depends on many factors and varies depending on the brand and type of meter. These factors include:  Low red blood cell count (anemia).  Substances in your blood (such as uric acid, vitamin C, and others).  Environmental factors (temperature, humidity, altitude).  Name-brand versus generic test strips.  Calibration. Make sure your meter is set up properly. It is a good idea to do a calibration test with a control solution recommended by the manufacturer of your meter whenever you begin using a fresh bottle of test strips. This will help verify the accuracy of your meter.  Improperly stored, expired, or defective test strips. Keep your strips in a dry place with the lid on.  Soiled meter.  Inadequate blood sample. NEW TECHNOLOGIES FOR GLUCOSE TESTING Alternative site testing Some glucose meters allow testing blood from alternative sites. These include the:  Upper  arm.  Forearm.  Base of the thumb.  Thigh. Sampling blood from alternative sites may be desirable. However, it may have some limitations. Blood in the fingertips show changes in glucose levels more quickly than blood in other parts of the body. This means that alternative site test results may be different from fingertip test results, not because of the meter's ability to test accurately, but because the actual glucose concentration can be different.  Continuous Glucose Monitoring Devices to measure your blood glucose continuously are available, and others are in development. These methods can be more expensive than self-monitoring with a glucose meter. However, it is uncertain how effective and reliable these devices are. Your caregiver will advise you if this approach makes sense for you. IF BLOOD SUGARS ARE CONTROLLED, PEOPLE WITH DIABETES REMAIN HEALTHIER.  SMBG is an important part of the treatment plan of patients with diabetes mellitus. Below are reasons for using SMBG:   It confirms that your glucose is at a specific, healthy level.  It detects hypoglycemia and severe hyperglycemia.  It allows you and your caregiver to make adjustments in response to changes in lifestyle for individuals requiring medicine.  It determines the need for starting insulin therapy in temporary diabetes that happens during pregnancy (gestational diabetes). Document Released: 08/08/2003 Document Revised: 10/28/2011 Document Reviewed: 11/29/2010 [facility] Patient Information 2014 [place], [place]. Monitoring for Diabetes There are two blood tests that help you monitor and manage your diabetes. These include:  An A1c (hemoglobin A1c) test.  This test is an average of your glucose (or blood sugar) control over the past 3 months.  This is recommended as a way for you and your caregiver to understand how well your glucose levels are controlled on the average.  Your A1c goal will be determined by your  caregiver, but it is usually best if it is less than 6.5% to 7.0%.  Glucose (sugar) attaches itself to red blood cells. The amount of glucose then can then be measured. The amount of glucose on the cells depends on how high your blood glucose has been.  SMBG test (self-monitoring blood glucose).  Using a blood glucose monitor (meter) to do SMBG testing is an easy way to monitor the amount of glucose in your blood and can help you improve your control. The monitor will tell you what your blood glucose is at that very moment. Every person with diabetes should have a blood glucose monitor and know how to use it. The better you control your blood sugar on a daily basis, the better  your A1c levels will be. HOW OFTEN SHOULD I HAVE AN A1C LEVEL?  Every 3 months if your diabetes is not well controlled or if therapy has changed.  Every 6 months if you are meeting your treatment goals. HOW OFTEN SHOULD I DO SMBG TESTING?  Your caregiver will recommend how often you should test. Testing times are based on the kind of medicine you take, type of diabetes you have, and your blood glucose control. Testing times can include:  Type 1 diabetes: test 3 or 4 times a day or as directed.  Type 2 diabetes and if you are taking insulin and diabetes pills: test 3 or 4 times a day or as directed.  If you are taking diabetes pills only and not reaching your target A1c: test 2 to 4 times a day or as directed.  If you are taking diabetes pills and are controlling your diabetes well with diet and exercise, your caregiver will help you decide what is appropriate. WHAT TIME OF DAY SHOULD I TEST?  The best time of day to test your blood glucose depends on medications, mealtimes, exercise, and blood glucose control. It is best to test at different times because this will help you know how you are doing throughout the day. Your caregiver will help you decide what is best. WHAT SHOULD MY BLOOD GLUCOSE BE? Blood glucose target  goals may vary depending on each persons needs, whether they have type 1 or type 2 diabetes or what medications they are taking. However, as a general rule, blood glucose should be:  Before meals   70-130 mg/dl.  After meals    ..less than 180 mg/dl. CHECK YOUR BLOOD GLUCOSE IF:  You have symptoms of low blood sugar (hypoglycemia), which may include dizziness, shaking, sweating, chills and confusion.  You have symptoms of high blood sugar (hyperglycemia), which may include sleepiness, blurred vision, frequent urination and excessive thirst.  You are learning how meals, physical activity and medicine affect your blood glucose level. The more you learn about how various foods, your medications, and activities affect you, the better job you will do of taking care of yourself.  You have a job in which poor control could cause safety problems while driving or operating machinery. CHECK YOUR BLOOD SUGAR MORE FREQUENTLY:  If you have medication or dietary changes.  If you begin taking other kinds of medicines.  If you become sick or your level of stress increases. With an illness, your blood sugar may even be high without eating.  Before and after exercise. Follow your caregiver's testing recommendations during this time.  TO DISPOSE OF SHARPS: Each city or state may have different regulations. Check with your public works or Engineer, structural.  Sharps containers can be purchased from pharmacies.  Place all used sharps in a container. You do not need to replace any protective covers over the needle or break the needle.  Sharps should be contained in a ridge, leakproof, puncture-resistant container.  Plastic detergent bottle.  Bleach bottle.  When container is almost full, add a solution that is 1 part laundry bleach and 9 parts tap water (it is okay to use undiluted bleach if you wish). You may want to wear gloves since bleach can damage tissue. Let the solution sit for 30  minutes.  Carefully pour all the liquid into the sanitary sewer. Be sure to prevent the sharps from falling out.  Once liquid is drained, reseal the container with lid and tape it shut with  duct tape. This will prevent the cap from coming off.  Dispose of the container with your regular household trash and waste. It is a good idea to let your trash hauler know that you will be disposing of sharps. Document Released: 08/08/2003 Document Revised: 04/29/2012 Document Reviewed: 02/06/2009 [facility] Patient Information 2014 [place], [place]. Hypoglycemia (Low Blood Sugar) Hypoglycemia is when the glucose (sugar) in your blood is too low. Hypoglycemia can happen for many reasons. It can happen to people with or without diabetes. Hypoglycemia can develop quickly and can be a medical emergency.  CAUSES  Having hypoglycemia does not mean that you will develop diabetes. Different causes include:  Missed or delayed meals or not enough carbohydrates eaten.  Medication overdose. This could be by accident or deliberate. If by accident, your medication may need to be adjusted or changed.  Exercise or increased activity without adjustments in carbohydrates or medications.  A nerve disorder that affects body functions like your heart rate, blood pressure and digestion (autonomic neuropathy).  A condition where the stomach muscles do not function properly (gastroparesis). Therefore, medications may not absorb properly.  The inability to recognize the signs of hypoglycemia (hypoglycemic unawareness).  Absorption of insulin  may be altered.  Alcohol consumption.  Pregnancy/menstrual cycles/postpartum. This may be due to hormones.  Certain kinds of tumors. This is very rare. SYMPTOMS   Sweating.  Hunger.  Dizziness.  Blurred vision.  Drowsiness.  Weakness.  Headache.  Rapid heart beat.  Shakiness.  Nervousness. DIAGNOSIS  Diagnosis is made by monitoring blood glucose in one or all of  the following ways:  Fingerstick blood glucose monitoring.  Laboratory results. TREATMENT  If you think your blood glucose is low:  Check your blood glucose, if possible. If it is less than 70 mg/dl, take one of the following:  3-4 glucose tablets.   cup juice (prefer clear like apple).   cup "regular" soda pop.  1 cup milk.  -1 tube of glucose gel.  5-6 hard candies.  Do not over treat because your blood glucose (sugar) will only go too high.  Wait 15 minutes and recheck your blood glucose. If it is still less than 70 mg/dl (or below your target range), repeat treatment.  Eat a snack if it is more than one hour until your next meal. Sometimes, your blood glucose may go so low that you are unable to treat yourself. You may need someone to help you. You may even pass out or be unable to swallow. This may require you to get an injection of glucagon, which raises the blood glucose. HOME CARE INSTRUCTIONS  Check blood glucose as recommended by your caregiver.  Take medication as prescribed by your caregiver.  Follow your meal plan. Do not skip meals. Eat on time.  If you are going to drink alcohol, drink it only with meals.  Check your blood glucose before driving.  Check your blood glucose before and after exercise. If you exercise longer or different than usual, be sure to check blood glucose more frequently.  Always carry treatment with you. Glucose tablets are the easiest to carry.  Always wear medical alert jewelry or carry some form of identification that states that you have diabetes. This will alert people that you have diabetes. If you have hypoglycemia, they will have a better idea on what to do. SEEK MEDICAL CARE IF:   You are having problems keeping your blood sugar at target range.  You are having frequent episodes of hypoglycemia.  You feel you might be having side effects from your medicines.  You have symptoms of an illness that is not improving  after 3-4 days.  You notice a change in vision or a new problem with your vision. SEEK IMMEDIATE MEDICAL CARE IF:   You are a family member or friend of a person whose blood glucose goes below 70 mg/dl and is accompanied by:  Confusion.  A change in mental status.  The inability to swallow.  Passing out. Document Released: 08/05/2005 Document Revised: 10/28/2011 Document Reviewed: 12/02/2011 [facility] Patient Information 2014 [place], [place].

## 2013-03-17 ENCOUNTER — Telehealth: Payer: Self-pay | Admitting: *Deleted

## 2013-03-17 ENCOUNTER — Ambulatory Visit (HOSPITAL_COMMUNITY): Payer: Self-pay | Admitting: Psychiatry

## 2013-03-17 LAB — VITAMIN B12: Vitamin B-12: 1192 pg/mL — ABNORMAL HIGH (ref 211–911)

## 2013-03-17 NOTE — Telephone Encounter (Signed)
03/17/13 Spoke with patient made aware sed rate was elevated and vitamin D level came back low Recommend taking Vitamin D and Calcium over the counter. P.Dasiah Hooley,BSN MHA

## 2013-03-17 NOTE — Progress Notes (Signed)
Quick Note:  Please inform patient that her sed rate was elevated which can be a sign of inflammation in the body. The vitamin D level came back low. Recommend that patient take over the counter Calcium and Vit D daily. Other labs came back OK.    Rodney Langton, MD, CDE, FAAFP [facility] [place], [place]   ______

## 2013-03-18 ENCOUNTER — Ambulatory Visit: Payer: Self-pay

## 2013-03-24 ENCOUNTER — Ambulatory Visit (INDEPENDENT_AMBULATORY_CARE_PROVIDER_SITE_OTHER): Payer: No Typology Code available for payment source | Admitting: Psychiatry

## 2013-03-24 ENCOUNTER — Encounter (HOSPITAL_COMMUNITY): Payer: Self-pay | Admitting: Psychiatry

## 2013-03-24 DIAGNOSIS — F341 Dysthymic disorder: Secondary | ICD-10-CM

## 2013-03-24 NOTE — Progress Notes (Signed)
   THERAPIST PROGRESS NOTE  Session Time: 12:30-1:30  Participation Level: Active  Behavioral Response: CasualAlertEuthymic  Type of Therapy: Individual Therapy  Treatment Goals addressed: emotion regulation  Interventions: CBT  Summary: Nancy Singh is a 52 y.o. female who presents with major depressive disorder.   Suicidal/Homicidal: Nowithout intent/plan  Therapist Response: Pt. Reported significant improvement in mood as indicated by absence of paranoid thoughts, improved outlook/hopefulness about the future, ability to engage communication and relationships with others. Pt. Indicated willingness to engage in the therapeutic process so that she can work toward goals of having a home and opening self to significant relationships.  Plan: Return again in 2 weeks.  Diagnosis: Axis I: Depressive Disorder NOS    Axis II: No diagnosis    Wynonia Musty 03/24/2013

## 2013-03-30 ENCOUNTER — Ambulatory Visit (INDEPENDENT_AMBULATORY_CARE_PROVIDER_SITE_OTHER): Payer: No Typology Code available for payment source | Admitting: Psychiatry

## 2013-03-30 ENCOUNTER — Encounter (HOSPITAL_COMMUNITY): Payer: Self-pay | Admitting: Psychiatry

## 2013-03-30 ENCOUNTER — Encounter (HOSPITAL_COMMUNITY): Payer: Self-pay | Admitting: Emergency Medicine

## 2013-03-30 ENCOUNTER — Emergency Department (INDEPENDENT_AMBULATORY_CARE_PROVIDER_SITE_OTHER)
Admission: EM | Admit: 2013-03-30 | Discharge: 2013-03-30 | Disposition: A | Discharge status: Home / Self Care | Payer: PRIVATE HEALTH INSURANCE | Source: Home / Self Care

## 2013-03-30 VITALS — BP 120/62 | HR 68 | Resp 12 | Ht 61.0 in | Wt 126.7 lb

## 2013-03-30 DIAGNOSIS — J069 Acute upper respiratory infection, unspecified: Secondary | ICD-10-CM

## 2013-03-30 DIAGNOSIS — H1130 Conjunctival hemorrhage, unspecified eye: Secondary | ICD-10-CM

## 2013-03-30 DIAGNOSIS — H1132 Conjunctival hemorrhage, left eye: Secondary | ICD-10-CM

## 2013-03-30 DIAGNOSIS — F329 Major depressive disorder, single episode, unspecified: Secondary | ICD-10-CM

## 2013-03-30 MED ORDER — FLUOXETINE HCL 10 MG PO CAPS: ORAL_CAPSULE | ORAL | Status: DC

## 2013-03-30 MED ORDER — ACETAMINOPHEN-CODEINE #3 300-30 MG PO TABS: 1.0000 | ORAL_TABLET | Freq: Four times a day (QID) | ORAL | Status: DC | PRN

## 2013-03-30 NOTE — ED Notes (Signed)
Pt reports redness and drainage from left eye x 2 days with mild pain. "both eyes are crusted over in the a.m." Chest congestion with a productive cough producing white sputum. Denies fever and any other symptoms.   Pt has not tried any otc meds for symptoms.

## 2013-03-30 NOTE — ED Provider Notes (Signed)
Nancy Singh is a 52 y.o. female who presents to Urgent Care today for cough present for the last 4 days. 2 days ago she developed bleeding underneath her left conjunctiva in her eye. The cough is productive of white sputum. She denies any chest pain palpitations or trouble breathing. She denies any nausea vomiting or diarrhea. She does not use any medications yet. No fevers or chills.   PMH reviewed. Diabetes and hypertension History  Substance Use Topics  . Smoking status: Never Smoker   . Smokeless tobacco: Not on file  . Alcohol Use: No   ROS as above Medications reviewed. No current facility-administered medications for this encounter.   Current Outpatient Prescriptions  Medication Sig Dispense Refill  . ferrous sulfate 325 (65 FE) MG tablet Take 1 tablet (325 mg total) by mouth 2 (two) times daily with a meal.  60 tablet  3  . insulin aspart (NOVOLOG) 100 UNIT/ML injection Inject 0-15 Units into the skin 3 (three) times daily with meals. CBG 121 - 150: 2 units,151 - 200: 3 units, 201 - 250: 5 units, 251 - 300: 8 units, 301 - 350: 11 units, 351 - 400: 15 units  1 vial  3  . insulin glargine (LANTUS) 100 UNIT/ML injection Inject 0.12 mLs (12 Units total) into the skin daily.  10 mL  3  . lisinopril (PRINIVIL,ZESTRIL) 10 MG tablet Take 1 tablet (10 mg total) by mouth daily.  30 tablet  2  . pantoprazole (PROTONIX) 40 MG tablet Take 1 tablet (40 mg total) by mouth daily.  30 tablet  3  . rosuvastatin (CRESTOR) 10 MG tablet Take 1 tablet (10 mg total) by mouth daily.  30 tablet  2  . simethicone (GAS-X) 80 MG chewable tablet Chew 1 tablet (80 mg total) by mouth every 6 (six) hours as needed for flatulence.  30 tablet  0  . acetaminophen-codeine (TYLENOL #3) 300-30 MG per tablet Take 1-2 tablets by mouth every 6 (six) hours as needed (cough).  30 tablet  0  . desloratadine (CLARINEX) 5 MG tablet Take 1 tablet (5 mg total) by mouth daily.  30 tablet  3  . FLUoxetine (PROZAC) 10 MG  capsule Take 3 capsule daily  90 capsule  0  . guaiFENesin (MUCINEX) 600 MG 12 hr tablet Take 1 tablet (600 mg total) by mouth 2 (two) times daily. For congestion  30 tablet  0    Exam:  BP 132/61  Pulse 86  Temp(Src) 97.7 F (36.5 C) (Oral)  Resp 12  SpO2 100%  LMP 03/28/2013 Gen: Well NAD HEENT: EOMI,  MMM, left subconjunctival hematoma. Bilateral eyes normal otherwise.  Posterior pharynx erythematous Tympanic membranes are normal appearing bilaterally Lungs: CTABL Nl WOB Heart: RRR no MRG Abd: NABS, NT, ND Exts: Non edematous BL  LE, warm and well perfused.   No results found for this or any previous visit (from the past 24 hour(s)). No results found.  Assessment and Plan: 52 y.o. female with viral URI with cough resulted in a left eye conjunctival hemorrhage. Plan to treat symptomatically. We'll use Tylenol #3 as needed for cough. Followup as needed if not improving.  Reassurance for the subconjunctival hematoma Discussed warning signs or symptoms. Please see discharge instructions. Patient expresses understanding.      Rodolph Bong, MD 03/30/13 [phone]
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
## 2013-03-31 ENCOUNTER — Telehealth (HOSPITAL_COMMUNITY): Payer: Self-pay | Admitting: *Deleted

## 2013-03-31 DIAGNOSIS — F329 Major depressive disorder, single episode, unspecified: Secondary | ICD-10-CM

## 2013-03-31 MED ORDER — FLUOXETINE HCL 10 MG PO CAPS: ORAL_CAPSULE | ORAL | Status: DC

## 2013-03-31 NOTE — ED Notes (Signed)
Chart review.. Patient has RX with her , has questions

## 2013-03-31 NOTE — Telephone Encounter (Signed)
Dr. Lolly Mustache authorized replacement of printed Prozac RX by sending to patient's pharmacy

## 2013-04-01 ENCOUNTER — Telehealth: Payer: Self-pay

## 2013-04-01 DIAGNOSIS — Z0279 Encounter for issue of other medical certificate: Secondary | ICD-10-CM

## 2013-04-09 ENCOUNTER — Ambulatory Visit: Payer: Self-pay

## 2013-04-14 ENCOUNTER — Ambulatory Visit (HOSPITAL_COMMUNITY): Payer: Self-pay | Admitting: Psychiatry

## 2013-05-03 ENCOUNTER — Ambulatory Visit (INDEPENDENT_AMBULATORY_CARE_PROVIDER_SITE_OTHER): Payer: PRIVATE HEALTH INSURANCE | Admitting: Psychiatry

## 2013-05-03 ENCOUNTER — Encounter (HOSPITAL_COMMUNITY): Payer: Self-pay | Admitting: Psychiatry

## 2013-05-03 VITALS — BP 147/81 | HR 89 | Ht 61.0 in | Wt 129.2 lb

## 2013-05-03 DIAGNOSIS — F329 Major depressive disorder, single episode, unspecified: Secondary | ICD-10-CM

## 2013-05-03 MED ORDER — FLUOXETINE HCL 10 MG PO CAPS: ORAL_CAPSULE | ORAL | Status: DC
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
## 2013-05-05 ENCOUNTER — Ambulatory Visit (INDEPENDENT_AMBULATORY_CARE_PROVIDER_SITE_OTHER): Payer: PRIVATE HEALTH INSURANCE | Admitting: Psychiatry

## 2013-05-05 ENCOUNTER — Encounter (HOSPITAL_COMMUNITY): Payer: Self-pay | Admitting: Psychiatry

## 2013-05-05 ENCOUNTER — Ambulatory Visit (HOSPITAL_COMMUNITY): Payer: Self-pay | Admitting: Psychiatry

## 2013-05-05 DIAGNOSIS — F329 Major depressive disorder, single episode, unspecified: Secondary | ICD-10-CM

## 2013-05-05 NOTE — Progress Notes (Signed)
Patient ID: Nancy Singh, female   DOB: 1961-02-28, 52 y.o.   MRN: [id number]  Session Time: 8:00-8:50  Participation Level: Active   Behavioral Response: CasualAlertEuthymic   Type of Therapy: Individual Therapy   Treatment Goals addressed: emotion regulation   Interventions: CBT   Summary: Nancy Singh is a 52 y.o. female who presents with major depressive disorder.   Suicidal/Homicidal: Nowithout intent/plan   Therapist Response: Pt. Continues to report improvement in mood as indicated by absence of paranoid thoughts, improved outlook/hopefulness about the future, ability to engage communication and relationships with others. Processed themes related to communication patterns learned in family of origin and possible family history of depression. Pt. Reported that she has noticed a change in ability to recognize low blood sugar and to appropriately self-regulate with nutrition. Pt. Indicated that she has not seen a nutritionist to help manage type 1 diabetes in approximately 8 years and needs advice on how to make better food choices. Made referral to Wyona Almas, RD with family medicine.   Plan: Return again in 2 weeks.   Diagnosis: Axis I: Depressive Disorder NOS   Axis II: No diagnosis   Boneta Lucks, Ph.D., [facility], [facility] 05/05/2013

## 2013-05-26 ENCOUNTER — Ambulatory Visit (INDEPENDENT_AMBULATORY_CARE_PROVIDER_SITE_OTHER): Payer: PRIVATE HEALTH INSURANCE | Admitting: Psychiatry

## 2013-05-26 ENCOUNTER — Encounter (HOSPITAL_COMMUNITY): Payer: Self-pay | Admitting: Psychiatry

## 2013-05-26 ENCOUNTER — Encounter (INDEPENDENT_AMBULATORY_CARE_PROVIDER_SITE_OTHER): Payer: Self-pay

## 2013-05-26 DIAGNOSIS — F329 Major depressive disorder, single episode, unspecified: Secondary | ICD-10-CM

## 2013-05-26 DIAGNOSIS — F341 Dysthymic disorder: Secondary | ICD-10-CM

## 2013-05-26 NOTE — Progress Notes (Signed)
Patient ID: Nancy Singh, female   DOB: March 05, 1961, 52 y.o.   MRN: [id number]  Session Time: 8:00-8:50   Participation Level: Active   Behavioral Response: CasualAlertEuthymic   Type of Therapy: Individual Therapy   Treatment Goals addressed: emotion regulation   Interventions: CBT   Summary: Nancy Singh is a 52 y.o. female who presents with major depressive disorder.   Suicidal/Homicidal: Nowithout intent/plan   Therapist Response: Pt. Presents with blunted affect and reports decline in mood since last session as indicated by sleeping most of the day, no desire to participate in the day, no desire to check blood sugar, and feeling of hopelessness. Pt. Continues to report that she believes that the medication has relieved paranoid thoughts, restlessness and anxiety. Pt. Did not have motivation to follow up on referral with dietician. Pt. Is living in the homeless shelter and indicated concern about ability to adequately manage type 1 diabetes with the food choices available to her at the shelter. Recommended that Pt. Schedule an earlier appointment with Dr. Lolly Mustache due to symptoms of depression..  Plan: Return again in 2 weeks.   Diagnosis: Axis I: Depressive Disorder NOS   Axis II: No diagnosis   Boneta Lucks, Ph.D., [facility], [facility]  05/26/2013

## 2013-05-27 ENCOUNTER — Ambulatory Visit (HOSPITAL_COMMUNITY): Payer: Self-pay | Admitting: Psychiatry

## 2013-05-31 ENCOUNTER — Ambulatory Visit (HOSPITAL_COMMUNITY): Payer: Self-pay | Admitting: Psychiatry

## 2013-06-08 ENCOUNTER — Ambulatory Visit (INDEPENDENT_AMBULATORY_CARE_PROVIDER_SITE_OTHER): Payer: PRIVATE HEALTH INSURANCE | Admitting: Psychiatry

## 2013-06-08 ENCOUNTER — Encounter (HOSPITAL_COMMUNITY): Payer: Self-pay | Admitting: Psychiatry

## 2013-06-08 ENCOUNTER — Ambulatory Visit: Payer: PRIVATE HEALTH INSURANCE | Attending: Internal Medicine | Admitting: Internal Medicine

## 2013-06-08 ENCOUNTER — Encounter: Payer: Self-pay | Admitting: Internal Medicine

## 2013-06-08 ENCOUNTER — Encounter (HOSPITAL_COMMUNITY): Payer: Self-pay | Admitting: *Deleted

## 2013-06-08 VITALS — BP 135/80 | HR 80 | Ht 61.0 in | Wt 133.0 lb

## 2013-06-08 VITALS — BP 148/87 | HR 86 | Temp 97.7°F | Resp 16 | Ht 62.0 in | Wt 132.0 lb

## 2013-06-08 DIAGNOSIS — R5381 Other malaise: Secondary | ICD-10-CM | POA: Insufficient documentation

## 2013-06-08 DIAGNOSIS — L408 Other psoriasis: Secondary | ICD-10-CM | POA: Insufficient documentation

## 2013-06-08 DIAGNOSIS — F329 Major depressive disorder, single episode, unspecified: Secondary | ICD-10-CM

## 2013-06-08 DIAGNOSIS — IMO0001 Reserved for inherently not codable concepts without codable children: Secondary | ICD-10-CM | POA: Insufficient documentation

## 2013-06-08 DIAGNOSIS — L409 Psoriasis, unspecified: Secondary | ICD-10-CM

## 2013-06-08 DIAGNOSIS — Z23 Encounter for immunization: Secondary | ICD-10-CM

## 2013-06-08 MED ORDER — INSULIN ASPART 100 UNIT/ML ~~LOC~~ SOLN: 0.0000 [IU] | Freq: Three times a day (TID) | SUBCUTANEOUS | Status: AC

## 2013-06-08 MED ORDER — FLUOXETINE HCL 10 MG PO CAPS: ORAL_CAPSULE | ORAL | Status: AC

## 2013-06-08 NOTE — Progress Notes (Signed)
Pt is here for a f/u visit. Pt reports that she is having uncontrolled blood sugar.  She is feeling really tiered and sleepy. She is waking up really sweaty most nights.
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
## 2013-06-08 NOTE — Progress Notes (Signed)
Patient ID: Nancy Singh, female   DOB: 26-Apr-1961, 52 y.o.   MRN: [id number] HPI 52 Y/O female with uncontrolled type 1 DM, anxiety and depression here for followup. Has been following with psychiatry regarding her anxiety and depression. Patient reports that she feels tired and sleepy all the time. Her blood glucose ranges from as low as 30 to 400s. She reports a weak no pain at night feeling sweaty and has had episodes of blood glucose in the 30s  during the night and occasionally during the day as well. She reports taking Lantus and sliding scale insulin. She reports taking sliding scale insulin during that time as well depending upon the blood glucose level. She denies any headache, blurred vision, dizziness, chest pain, palpitations, shortness of breath, abdominal pain, nausea, vomiting, bowel symptoms. Reports polyuria and polydipsia.   Vital signs in last 24 hours:  Filed Vitals:   06/08/13 1048  BP: 148/87  Pulse: 86  Temp: 97.7 F (36.5 C)  TempSrc: Oral  Resp: 16  Height: 5\' 2"  (1.575 m)  Weight: 132 lb (59.875 kg)  SpO2: 100%      Physical Exam:  General: Middle aged female appears tired HEENT: no pallor, no icterus, moist oral mucosa,  Heart: Normal  s1 &s2  Regular rate and rhythm,  Lungs: Clear to auscultation bilaterally. Abdomen: Soft, nontender, nondistended, positive bowel sounds. Extremities: No clubbing cyanosis or edema with positive pedal pulses. Neuro: Alert, awake, oriented x3, nonfocal.   Lab Results:  Basic Metabolic Panel:    Component Value Date/Time   NA 138 11/30/2012 1936   K 4.0 11/30/2012 1936   CL 102 11/30/2012 1936   CO2 29 09/17/2012 1533   BUN 12 11/30/2012 1936   CREATININE 0.90 11/30/2012 1936   GLUCOSE 156* 11/30/2012 1936   CALCIUM 10.1 09/17/2012 1533   CBC:    Component Value Date/Time   WBC 8.1 09/17/2012 1533   HGB 12.6 11/30/2012 1936   HCT 37.0 11/30/2012 1936   PLT 294 09/17/2012 1533   MCV 83.4 09/17/2012 1533   NEUTROABS  12.8* 01/27/2012 1330   LYMPHSABS 1.0 01/27/2012 1330   MONOABS 0.9 01/27/2012 1330   EOSABS 0.0 01/27/2012 1330   BASOSABS 0.1 01/27/2012 1330    No results found for this or any previous visit (from the past 240 hour(s)).  Studies/Results: No results found.  Medications: Scheduled Meds: Continuous Infusions: PRN Meds:.    Assessment/Plan:  Uncontrolled diabetes mellitus An A1c checked today is 8.1. She should continue her current dose of Lantus 12 units daily in the morning. She has not been using her sliding scale properly. She reports taking only about 8 or 9 units of aspart with blood glucose of 400s. She also should stop taking bedtime aspart and only take it with meals 3 times a day. She has a prescription for sliding scale insulin which she should stick to . Will refer her to outpatient endocrinology and diabetes educator.  Excessive fatigue and sleepiness Appears multifactorial with associated depression, hypoglycemia and ? Hypothyroidism. Check TSH and free t4. Has been following with psych and her dose of prozac is being adjusted.  Psoriasis We'll refer her to dermatology  Health Maintenance Full vaccine ordered  Follow up with 3 weeks      Allice Garro 06/08/2013, 11:14 AM

## 2013-06-09 ENCOUNTER — Encounter (HOSPITAL_COMMUNITY): Payer: Self-pay | Admitting: Psychiatry

## 2013-06-09 ENCOUNTER — Ambulatory Visit (INDEPENDENT_AMBULATORY_CARE_PROVIDER_SITE_OTHER): Payer: PRIVATE HEALTH INSURANCE | Admitting: Psychiatry

## 2013-06-09 ENCOUNTER — Encounter (HOSPITAL_COMMUNITY): Payer: Self-pay | Admitting: *Deleted

## 2013-06-09 DIAGNOSIS — F329 Major depressive disorder, single episode, unspecified: Secondary | ICD-10-CM

## 2013-06-09 DIAGNOSIS — F341 Dysthymic disorder: Secondary | ICD-10-CM

## 2013-06-09 LAB — T4, FREE: Free T4: 1.05 ng/dL (ref 0.80–1.80)

## 2013-06-09 NOTE — Progress Notes (Signed)
Patient ID: IXEL BOEHNING, female   DOB: 1961/04/20, 52 y.o.   MRN: [id number]  Session Time: 9:00-9:50   Participation Level: Active   Behavioral Response: CasualAlertEuthymic   Type of Therapy: Individual Therapy   Treatment Goals addressed: emotion regulation   Interventions: CBT   Summary: Nancy Singh is a 52 y.o. female who presents with major depressive disorder.   Suicidal/Homicidal: Nowithout intent/plan   Therapist Response: Pt. Reports that she is feeling better and sleeping less since she started dividing medication dose throughout day per Dr. Sheela Stack recommendation. Pt. Presents today with bright affect, smiles and laughs appropriately, hopeful about the future and being able to move out of the shelter and live independently. Pt. Reports significant forgetfulness and concerns that she is not able to return to work at this time because of her depression. Pt. Reports that she followed up with primary care provider since last session due concerns about regulating blood sugar, and doctor modified her insulin. Pt. Reports that appropriate food choices are difficult at the shelter, but she supplements as much as she is able with protein sources and fresh fruit. Session focused on exploring family patterns of communication, disengagement, and family history of depression.  Plan: Return again in 2 weeks.   Diagnosis: Axis I: Depressive Disorder NOS   Axis II: No diagnosis   Boneta Lucks, Ph.D., [facility], [facility]  06/09/2013

## 2013-06-14 ENCOUNTER — Ambulatory Visit (HOSPITAL_COMMUNITY): Payer: Self-pay | Admitting: Psychiatry

## 2013-07-08 ENCOUNTER — Ambulatory Visit (HOSPITAL_COMMUNITY): Payer: Self-pay | Admitting: Psychiatry

## 2013-07-09 ENCOUNTER — Encounter (HOSPITAL_COMMUNITY): Payer: Self-pay

## 2013-07-16 ENCOUNTER — Ambulatory Visit: Payer: Self-pay

## 2013-07-30 ENCOUNTER — Ambulatory Visit: Payer: Self-pay

## 2013-08-03 ENCOUNTER — Ambulatory Visit: Payer: Self-pay

## 2013-09-13 ENCOUNTER — Telehealth (HOSPITAL_COMMUNITY): Payer: Self-pay | Admitting: *Deleted

## 2013-09-13 NOTE — Telephone Encounter (Signed)
Left message 1/23 @ 1252: VM recv'd 1/26 @ 0945: Needs to talk about a medicine  Patient requested date of 1st appt with Dr.Arfeen.Also wants to know when he prescribed Klonopin 5 tablets.Informed pt that Klonopin prescribed at first appt 11/23/12,discontinued next appt 12/15/12.Pt asked if she can request records.Informed her records can be requested through form filled out at office.

## 2013-09-16 ENCOUNTER — Ambulatory Visit: Payer: Self-pay

## 2013-09-21 ENCOUNTER — Encounter (HOSPITAL_COMMUNITY): Payer: Self-pay | Admitting: *Deleted

## 2013-09-22 ENCOUNTER — Ambulatory Visit: Payer: Self-pay

## 2013-09-22 ENCOUNTER — Ambulatory Visit: Payer: Self-pay | Admitting: Internal Medicine

## 2013-10-05 ENCOUNTER — Ambulatory Visit: Payer: Self-pay

## 2013-11-06 IMAGING — CR DG CHEST 1V PORT
1 series · 1 of 1 positions shown · non-contrast
Comparison: 06/28/2010

CLINICAL DATA: Cough and chest congestion

PORTABLE CHEST - 1 VIEW

[AP]
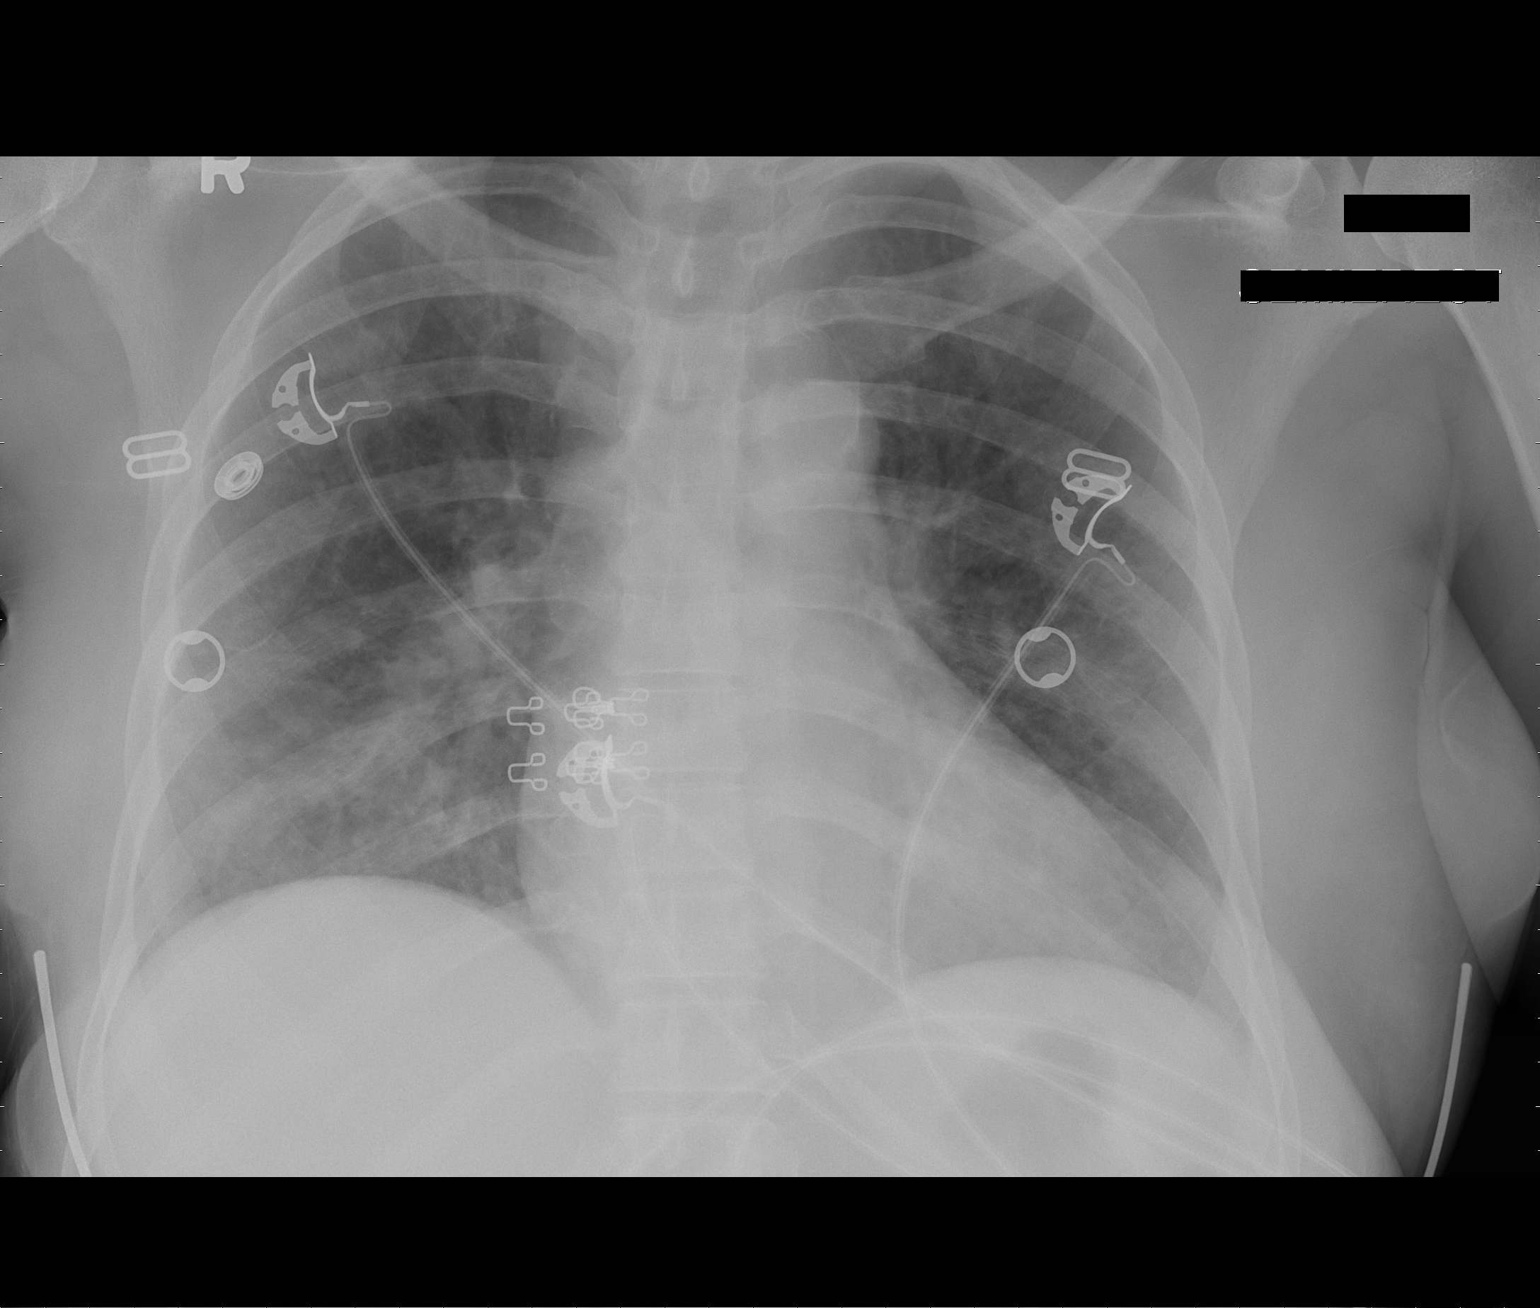

[1 of 1 positions shown; findings below may reference images not displayed]

FINDINGS: Examination quality is suboptimal due to use of portable
technique.  New patchy right lower lobe ill-defined airspace
opacity is noted.  Heart size upper limits of normal.  No pleural
effusion.  No acute osseous finding.
IMPRESSION: New patchy right lower lobe airspace opacity which could represent
pneumonia given the clinical history.  Follow-up PA and lateral
chest radiographs are recommended for better evaluation when the
patient is clinically able.

## 2013-11-08 ENCOUNTER — Ambulatory Visit: Payer: Self-pay | Attending: Internal Medicine

## 2013-11-08 ENCOUNTER — Encounter: Payer: Self-pay | Admitting: Internal Medicine

## 2013-11-08 ENCOUNTER — Ambulatory Visit: Payer: Self-pay | Attending: Internal Medicine | Admitting: Internal Medicine

## 2013-11-08 VITALS — BP 152/89 | HR 80 | Temp 98.1°F | Resp 17 | Wt 144.0 lb

## 2013-11-08 DIAGNOSIS — Z79899 Other long term (current) drug therapy: Secondary | ICD-10-CM | POA: Insufficient documentation

## 2013-11-08 DIAGNOSIS — I10 Essential (primary) hypertension: Secondary | ICD-10-CM

## 2013-11-08 DIAGNOSIS — R202 Paresthesia of skin: Secondary | ICD-10-CM

## 2013-11-08 DIAGNOSIS — E785 Hyperlipidemia, unspecified: Secondary | ICD-10-CM

## 2013-11-08 DIAGNOSIS — E1039 Type 1 diabetes mellitus with other diabetic ophthalmic complication: Secondary | ICD-10-CM | POA: Insufficient documentation

## 2013-11-08 DIAGNOSIS — Z794 Long term (current) use of insulin: Secondary | ICD-10-CM | POA: Insufficient documentation

## 2013-11-08 DIAGNOSIS — E7800 Pure hypercholesterolemia, unspecified: Secondary | ICD-10-CM | POA: Insufficient documentation

## 2013-11-08 DIAGNOSIS — R200 Anesthesia of skin: Secondary | ICD-10-CM

## 2013-11-08 DIAGNOSIS — Z139 Encounter for screening, unspecified: Secondary | ICD-10-CM

## 2013-11-08 DIAGNOSIS — K219 Gastro-esophageal reflux disease without esophagitis: Secondary | ICD-10-CM | POA: Insufficient documentation

## 2013-11-08 DIAGNOSIS — R209 Unspecified disturbances of skin sensation: Secondary | ICD-10-CM

## 2013-11-08 DIAGNOSIS — E119 Type 2 diabetes mellitus without complications: Secondary | ICD-10-CM

## 2013-11-08 DIAGNOSIS — E11319 Type 2 diabetes mellitus with unspecified diabetic retinopathy without macular edema: Secondary | ICD-10-CM | POA: Insufficient documentation

## 2013-11-08 DIAGNOSIS — E559 Vitamin D deficiency, unspecified: Secondary | ICD-10-CM

## 2013-11-08 LAB — POCT GLYCOSYLATED HEMOGLOBIN (HGB A1C): HEMOGLOBIN A1C: 8.1

## 2013-11-08 LAB — GLUCOSE, POCT (MANUAL RESULT ENTRY): POC GLUCOSE: 268 mg/dL — AB (ref 70–99)

## 2013-11-08 MED ORDER — LISINOPRIL 10 MG PO TABS: 10.0000 mg | ORAL_TABLET | Freq: Every day | ORAL | Status: AC

## 2013-11-08 MED ORDER — GABAPENTIN 100 MG PO CAPS: 100.0000 mg | ORAL_CAPSULE | Freq: Every day | ORAL | Status: AC

## 2013-11-08 NOTE — Patient Instructions (Signed)
DASH Diet The DASH diet stands for "Dietary Approaches to Stop Hypertension." It is a healthy eating plan that has been shown to reduce high blood pressure (hypertension) in as little as 14 days, while also possibly providing other significant health benefits. These other health benefits include reducing the risk of breast cancer after menopause and reducing the risk of type 2 diabetes, heart disease, colon cancer, and stroke. Health benefits also include weight loss and slowing kidney failure in patients with chronic kidney disease.  DIET GUIDELINES  Limit salt (sodium). Your diet should contain less than 1500 mg of sodium daily.  Limit refined or processed carbohydrates. Your diet should include mostly whole grains. Desserts and added sugars should be used sparingly.  Include small amounts of heart-healthy fats. These types of fats include nuts, oils, and tub margarine. Limit saturated and trans fats. These fats have been shown to be harmful in the body. CHOOSING FOODS  The following food groups are based on a 2000 calorie diet. See your Registered Dietitian for individual calorie needs. Grains and Grain Products (6 to 8 servings daily)  Eat More Often: Whole-wheat bread, brown rice, whole-grain or wheat pasta, quinoa, popcorn without added fat or salt (air popped).  Eat Less Often: White bread, white pasta, white rice, cornbread. Vegetables (4 to 5 servings daily)  Eat More Often: Fresh, frozen, and canned vegetables. Vegetables may be raw, steamed, roasted, or grilled with a minimal amount of fat.  Eat Less Often/Avoid: Creamed or fried vegetables. Vegetables in a cheese sauce. Fruit (4 to 5 servings daily)  Eat More Often: All fresh, canned (in natural juice), or frozen fruits. Dried fruits without added sugar. One hundred percent fruit juice ( cup [237 mL] daily).  Eat Less Often: Dried fruits with added sugar. Canned fruit in light or heavy syrup. Lean Meats, Fish, and Poultry (2  servings or less daily. One serving is 3 to 4 oz [85-114 g]).  Eat More Often: Ninety percent or leaner ground beef, tenderloin, sirloin. Round cuts of beef, chicken breast, turkey breast. All fish. Grill, bake, or broil your meat. Nothing should be fried.  Eat Less Often/Avoid: Fatty cuts of meat, turkey, or chicken leg, thigh, or wing. Fried cuts of meat or fish. Dairy (2 to 3 servings)  Eat More Often: Low-fat or fat-free milk, low-fat plain or light yogurt, reduced-fat or part-skim cheese.  Eat Less Often/Avoid: Milk (whole, 2%).Whole milk yogurt. Full-fat cheeses. Nuts, Seeds, and Legumes (4 to 5 servings per week)  Eat More Often: All without added salt.  Eat Less Often/Avoid: Salted nuts and seeds, canned beans with added salt. Fats and Sweets (limited)  Eat More Often: Vegetable oils, tub margarines without trans fats, sugar-free gelatin. Mayonnaise and salad dressings.  Eat Less Often/Avoid: Coconut oils, palm oils, butter, stick margarine, cream, half and half, cookies, candy, pie. FOR MORE INFORMATION The Dash Diet Eating Plan: www.dashdiet.org Document Released: 07/25/2011 Document Revised: 10/28/2011 Document Reviewed: 07/25/2011 Exit[employer] Patient Information 2014 [employer]. Diabetes Meal Planning Guide The diabetes meal planning guide is a tool to help you plan your meals and snacks. It is important for people with diabetes to manage their blood glucose (sugar) levels. Choosing the right foods and the right amounts throughout your day will help control your blood glucose. Eating right can even help you improve your blood pressure and reach or maintain a healthy weight. CARBOHYDRATE COUNTING MADE EASY When you eat carbohydrates, they turn to sugar. This raises your blood glucose level. Counting carbohydrates   can help you control this level so you feel better. When you plan your meals by counting carbohydrates, you can have more flexibility in what you eat and balance  your medicine with your food intake. Carbohydrate counting simply means adding up the total amount of carbohydrate grams in your meals and snacks. Try to eat about the same amount at each meal. Foods with carbohydrates are listed below. Each portion below is 1 carbohydrate serving or 15 grams of carbohydrates. Ask your dietician how many grams of carbohydrates you should eat at each meal or snack. Grains and Starches  1 slice bread.   English muffin or hotdog/hamburger bun.   cup cold cereal (unsweetened).   cup cooked pasta or rice.   cup starchy vegetables (corn, potatoes, peas, beans, winter squash).  1 tortilla (6 inches).   bagel.  1 waffle or pancake (size of a CD).   cup cooked cereal.  4 to 6 small crackers. *Whole grain is recommended. Fruit  1 cup fresh unsweetened berries, melon, papaya, pineapple.  1 small fresh fruit.   banana or mango.   cup fruit juice (4 oz unsweetened).   cup canned fruit in natural juice or water.  2 tbs dried fruit.  12 to 15 grapes or cherries. Milk and Yogurt  1 cup fat-free or 1% milk.  1 cup soy milk.  6 oz light yogurt with sugar-free sweetener.  6 oz low-fat soy yogurt.  6 oz plain yogurt. Vegetables  1 cup raw or  cup cooked is counted as 0 carbohydrates or a "free" food.  If you eat 3 or more servings at 1 meal, count them as 1 carbohydrate serving. Other Carbohydrates   oz chips or pretzels.   cup ice cream or frozen yogurt.   cup sherbet or sorbet.  2 inch square cake, no frosting.  1 tbs honey, sugar, jam, jelly, or syrup.  2 small cookies.  3 squares of graham crackers.  3 cups popcorn.  6 crackers.  1 cup broth-based soup.  Count 1 cup casserole or other mixed foods as 2 carbohydrate servings.  Foods with less than 20 calories in a serving may be counted as 0 carbohydrates or a "free" food. You may want to purchase a book or computer software that lists the carbohydrate gram  counts of different foods. In addition, the nutrition facts panel on the labels of the foods you eat are a good source of this information. The label will tell you how big the serving size is and the total number of carbohydrate grams you will be eating per serving. Divide this number by 15 to obtain the number of carbohydrate servings in a portion. Remember, 1 carbohydrate serving equals 15 grams of carbohydrate. SERVING SIZES Measuring foods and serving sizes helps you make sure you are getting the right amount of food. The list below tells how big or small some common serving sizes are.  1 oz.........4 stacked dice.  3 oz.........Deck of cards.  1 tsp........Tip of little finger.  1 tbs........Thumb.  2 tbs........Golf ball.   cup.......Half of a fist.  1 cup........A fist. SAMPLE DIABETES MEAL PLAN Below is a sample meal plan that includes foods from the grain and starches, dairy, vegetable, fruit, and meat groups. A dietician can individualize a meal plan to fit your calorie needs and tell you the number of servings needed from each food group. However, controlling the total amount of carbohydrates in your meal or snack is more important than making sure you   include all of the food groups at every meal. You may interchange carbohydrate containing foods (dairy, starches, and fruits). The meal plan below is an example of a 2000 calorie diet using carbohydrate counting. This meal plan has 17 carbohydrate servings. Breakfast  1 cup oatmeal (2 carb servings).   cup light yogurt (1 carb serving).  1 cup blueberries (1 carb serving).   cup almonds. Snack  1 large apple (2 carb servings).  1 low-fat string cheese stick. Lunch  Chicken breast salad.  1 cup spinach.   cup chopped tomatoes.  2 oz chicken breast, sliced.  2 tbs low-fat Italian dressing.  12 whole-wheat crackers (2 carb servings).  12 to 15 grapes (1 carb serving).  1 cup low-fat milk (1 carb  serving). Snack  1 cup carrots.   cup hummus (1 carb serving). Dinner  3 oz broiled salmon.  1 cup brown rice (3 carb servings). Snack  1  cups steamed broccoli (1 carb serving) drizzled with 1 tsp olive oil and lemon juice.  1 cup light pudding (2 carb servings). DIABETES MEAL PLANNING WORKSHEET Your dietician can use this worksheet to help you decide how many servings of foods and what types of foods are right for you.  BREAKFAST Food Group and Servings / Carb Servings Grain/Starches __________________________________ Dairy __________________________________________ Vegetable ______________________________________ Fruit ___________________________________________ Meat __________________________________________ Fat ____________________________________________ LUNCH Food Group and Servings / Carb Servings Grain/Starches ___________________________________ Dairy ___________________________________________ Fruit ____________________________________________ Meat ___________________________________________ Fat _____________________________________________ DINNER Food Group and Servings / Carb Servings Grain/Starches ___________________________________ Dairy ___________________________________________ Fruit ____________________________________________ Meat ___________________________________________ Fat _____________________________________________ SNACKS Food Group and Servings / Carb Servings Grain/Starches ___________________________________ Dairy ___________________________________________ Vegetable _______________________________________ Fruit ____________________________________________ Meat ___________________________________________ Fat _____________________________________________ DAILY TOTALS Starches _________________________ Vegetable ________________________ Fruit ____________________________ Dairy ____________________________ Meat  ____________________________ Fat ______________________________ Document Released: 05/02/2005 Document Revised: 10/28/2011 Document Reviewed: 03/13/2009 [employer] Patient Information 2014 [employer].  

## 2013-11-08 NOTE — Progress Notes (Signed)
Patient here for follow up States her blood sugars have been running high

## 2013-11-08 NOTE — Progress Notes (Signed)
MRN: [id number] Name: Nancy Singh  Sex: female Age: 53 y.o. DOB: 11-May-1961  Allergies: Review of patient's allergies indicates no known allergies.  Chief Complaint  Patient presents with  . Follow-up    HPI: Patient is 53 y.o. female who has history of diabetes hypertension hyperlipidemia comes today for followup, as per patient she has not been taking her blood pressure medication but has been taking her Lantus and NovoLog sliding scale denies any headache dizziness chest pain or shortness of breath has she anxiety/depression following up with the psychiatrist, patient reported to have numbness tingling in the feet, she denies any hypoglycemic symptoms  Past Medical History  Diagnosis Date  . DM type 1 causing eye disease   . Acid reflux   . High cholesterol   . Psoriasis   . Menorrhagia   . Anemia   . Hyperglycemia   . Allergic rhinitis   . Diabetic retinopathy associated with type 1 diabetes mellitus   . GERD (gastroesophageal reflux disease)   . Cachexia     History reviewed. No pertinent past surgical history.    Medication List       This list is accurate as of: 11/08/13  6:07 PM.  Always use your most recent med list.               ferrous sulfate 325 (65 FE) MG tablet  Take 1 tablet (325 mg total) by mouth 2 (two) times daily with a meal.     FLUoxetine 10 MG capsule  Commonly known as:  PROZAC  Take 3 capsule daily     gabapentin 100 MG capsule  Commonly known as:  NEURONTIN  Take 1 capsule (100 mg total) by mouth at bedtime.     guaiFENesin 600 MG 12 hr tablet  Commonly known as:  MUCINEX  Take 1 tablet (600 mg total) by mouth 2 (two) times daily. For congestion     insulin aspart 100 UNIT/ML injection  Commonly known as:  novoLOG  Inject 0-15 Units into the skin 3 (three) times daily with meals. CBG 121 - 150: 2 units,151 - 200: 3 units, 201 - 250: 5 units, 251 - 300: 8 units, 301 - 350: 11 units, 351 - 400: 15 units     insulin  glargine 100 UNIT/ML injection  Commonly known as:  LANTUS  Inject 0.12 mLs (12 Units total) into the skin daily.     lisinopril 10 MG tablet  Commonly known as:  PRINIVIL,ZESTRIL  Take 1 tablet (10 mg total) by mouth daily.     pantoprazole 40 MG tablet  Commonly known as:  PROTONIX  Take 1 tablet (40 mg total) by mouth daily.     rosuvastatin 10 MG tablet  Commonly known as:  CRESTOR  Take 1 tablet (10 mg total) by mouth daily.        Meds ordered this encounter  Medications  . gabapentin (NEURONTIN) 100 MG capsule    Sig: Take 1 capsule (100 mg total) by mouth at bedtime.    Dispense:  90 capsule    Refill:  1  . lisinopril (PRINIVIL,ZESTRIL) 10 MG tablet    Sig: Take 1 tablet (10 mg total) by mouth daily.    Dispense:  30 tablet    Refill:  2    Immunization History  Administered Date(s) Administered  . Influenza, Seasonal, Injecte, Preservative Fre 06/08/2013  . Td 02/15/2002    History reviewed. No pertinent family history.  History  Substance Use Topics  . Smoking status: Never Smoker   . Smokeless tobacco: Not on file  . Alcohol Use: No    Review of Systems   As noted in HPI  Filed Vitals:   11/08/13 1416  BP: 152/89  Pulse: 80  Temp: 98.1 F (36.7 C)  Resp: 17    Physical Exam  Physical Exam  Constitutional: No distress.  Eyes: EOM are normal. Pupils are equal, round, and reactive to light.  Cardiovascular: Normal rate and regular rhythm.   Pulmonary/Chest: Breath sounds normal. No respiratory distress. She has no wheezes. She has no rales.    CBC    Component Value Date/Time   WBC 8.1 09/17/2012 1533   RBC 4.75 09/17/2012 1533   RBC 4.81 01/27/2012 1909   HGB 12.6 11/30/2012 1936   HCT 37.0 11/30/2012 1936   PLT 294 09/17/2012 1533   MCV 83.4 09/17/2012 1533   LYMPHSABS 1.0 01/27/2012 1330   MONOABS 0.9 01/27/2012 1330   EOSABS 0.0 01/27/2012 1330   BASOSABS 0.1 01/27/2012 1330    CMP     Component Value Date/Time   NA 138  11/30/2012 1936   K 4.0 11/30/2012 1936   CL 102 11/30/2012 1936   CO2 29 09/17/2012 1533   GLUCOSE 156* 11/30/2012 1936   BUN 12 11/30/2012 1936   CREATININE 0.90 11/30/2012 1936   CALCIUM 10.1 09/17/2012 1533   PROT 8.1 09/17/2012 1533   ALBUMIN 3.9 09/17/2012 1533   AST 19 09/17/2012 1533   ALT 9 09/17/2012 1533   ALKPHOS 84 09/17/2012 1533   BILITOT 0.3 09/17/2012 1533   GFRNONAA 72* 09/17/2012 1533   GFRAA 83* 09/17/2012 1533    Lab Results  Component Value Date/Time   CHOL 314* 09/17/2012  3:34 PM    No components found with this basename: hga1c    Lab Results  Component Value Date/Time   AST 19 09/17/2012  3:33 PM    Assessment and Plan  DM (diabetes mellitus) - Plan: Glucose (CBG), HgB A1c, Lipid panel Results for orders placed in visit on 11/08/13  GLUCOSE, POCT (MANUAL RESULT ENTRY)      Result Value Ref Range   POC Glucose 268 (*) 70 - 99 mg/dl  POCT GLYCOSYLATED HEMOGLOBIN (HGB A1C)      Result Value Ref Range   Hemoglobin A1C 8.1     Patient has been advised to increase the Lantus to 30 units continue with aspart  NovoLog sliding scale and keep the fingerstick log  Essential hypertension, benign - Plan: Resume back on lisinopril (PRINIVIL,ZESTRIL) 10 MG tablet  Other and unspecified hyperlipidemia - Plan: Lipid panel,   Numbness and tingling of foot gabapentin (NEURONTIN) 100 MG capsule   Unspecified vitamin D deficiency - Plan repeat vitamin D level.  Screening - Plan: Vit D  25 hydroxy (rtn osteoporosis monitoring), COMPLETE METABOLIC PANEL WITH GFR    Return in about 3 months (around 02/08/2014) for hypertension, diabetes.  Doris CheadleADVANI, Tamyra Fojtik, MD

## 2013-12-07 ENCOUNTER — Ambulatory Visit (HOSPITAL_COMMUNITY): Payer: Self-pay | Admitting: Psychiatry

## 2014-01-19 ENCOUNTER — Emergency Department (HOSPITAL_COMMUNITY)
Admission: EM | Admit: 2014-01-19 | Discharge: 2014-01-20 | Disposition: A | Discharge status: Home / Self Care | Payer: Self-pay | Attending: Emergency Medicine | Admitting: Emergency Medicine

## 2014-01-19 ENCOUNTER — Encounter (HOSPITAL_COMMUNITY): Payer: Self-pay | Admitting: Emergency Medicine

## 2014-01-19 DIAGNOSIS — E11319 Type 2 diabetes mellitus with unspecified diabetic retinopathy without macular edema: Secondary | ICD-10-CM | POA: Insufficient documentation

## 2014-01-19 DIAGNOSIS — R29818 Other symptoms and signs involving the nervous system: Secondary | ICD-10-CM | POA: Insufficient documentation

## 2014-01-19 DIAGNOSIS — E1039 Type 1 diabetes mellitus with other diabetic ophthalmic complication: Secondary | ICD-10-CM | POA: Insufficient documentation

## 2014-01-19 DIAGNOSIS — Z872 Personal history of diseases of the skin and subcutaneous tissue: Secondary | ICD-10-CM | POA: Insufficient documentation

## 2014-01-19 DIAGNOSIS — R011 Cardiac murmur, unspecified: Secondary | ICD-10-CM | POA: Insufficient documentation

## 2014-01-19 DIAGNOSIS — F411 Generalized anxiety disorder: Secondary | ICD-10-CM | POA: Insufficient documentation

## 2014-01-19 DIAGNOSIS — Z8709 Personal history of other diseases of the respiratory system: Secondary | ICD-10-CM | POA: Insufficient documentation

## 2014-01-19 DIAGNOSIS — E7800 Pure hypercholesterolemia, unspecified: Secondary | ICD-10-CM | POA: Insufficient documentation

## 2014-01-19 DIAGNOSIS — D649 Anemia, unspecified: Secondary | ICD-10-CM | POA: Insufficient documentation

## 2014-01-19 DIAGNOSIS — K219 Gastro-esophageal reflux disease without esophagitis: Secondary | ICD-10-CM | POA: Insufficient documentation

## 2014-01-19 DIAGNOSIS — Z3202 Encounter for pregnancy test, result negative: Secondary | ICD-10-CM | POA: Insufficient documentation

## 2014-01-19 DIAGNOSIS — Z794 Long term (current) use of insulin: Secondary | ICD-10-CM | POA: Insufficient documentation

## 2014-01-19 DIAGNOSIS — G5790 Unspecified mononeuropathy of unspecified lower limb: Secondary | ICD-10-CM | POA: Insufficient documentation

## 2014-01-19 DIAGNOSIS — R51 Headache: Secondary | ICD-10-CM | POA: Insufficient documentation

## 2014-01-19 DIAGNOSIS — F419 Anxiety disorder, unspecified: Secondary | ICD-10-CM

## 2014-01-19 DIAGNOSIS — Z79899 Other long term (current) drug therapy: Secondary | ICD-10-CM | POA: Insufficient documentation

## 2014-01-19 DIAGNOSIS — Z8742 Personal history of other diseases of the female genital tract: Secondary | ICD-10-CM | POA: Insufficient documentation

## 2014-01-19 LAB — COMPREHENSIVE METABOLIC PANEL
AST: 14 U/L (ref 0–37)
Albumin: 3.6 g/dL (ref 3.5–5.2)
Alkaline Phosphatase: 97 U/L (ref 39–117)
BUN: 17 mg/dL (ref 6–23)
CALCIUM: 9.7 mg/dL (ref 8.4–10.5)
CO2: 27 mEq/L (ref 19–32)
CREATININE: 0.97 mg/dL (ref 0.50–1.10)
Chloride: 97 mEq/L (ref 96–112)
GFR calc non Af Amer: 66 mL/min — ABNORMAL LOW (ref >90)
GFR, EST AFRICAN AMERICAN: 77 mL/min — AB (ref >90)
GLUCOSE: 228 mg/dL — AB (ref 70–99)
Potassium: 4.2 mEq/L (ref 3.7–5.3)
SODIUM: 135 meq/L — AB (ref 137–147)
TOTAL PROTEIN: 7.9 g/dL (ref 6.0–8.3)
Total Bilirubin: 0.3 mg/dL (ref 0.3–1.2)

## 2014-01-19 LAB — CBC WITH DIFFERENTIAL/PLATELET
BASOS ABS: 0.1 10*3/uL (ref 0.0–0.1)
Basophils Relative: 1 % (ref 0–1)
EOS ABS: 0.6 10*3/uL (ref 0.0–0.7)
EOS PCT: 10 % — AB (ref 0–5)
HCT: 37.5 % (ref 36.0–46.0)
Hemoglobin: 12.3 g/dL (ref 12.0–15.0)
LYMPHS ABS: 2 10*3/uL (ref 0.7–4.0)
Lymphocytes Relative: 35 % (ref 12–46)
MCH: 25.9 pg — AB (ref 26.0–34.0)
MCHC: 32.8 g/dL (ref 30.0–36.0)
MCV: 78.9 fL (ref 78.0–100.0)
Monocytes Absolute: 0.3 10*3/uL (ref 0.1–1.0)
Monocytes Relative: 6 % (ref 3–12)
Neutro Abs: 2.7 10*3/uL (ref 1.7–7.7)
Neutrophils Relative %: 48 % (ref 43–77)
PLATELETS: 316 10*3/uL (ref 150–400)
RBC: 4.75 MIL/uL (ref 3.87–5.11)
RDW: 15.4 % (ref 11.5–15.5)
WBC: 5.7 10*3/uL (ref 4.0–10.5)

## 2014-01-19 LAB — URINALYSIS, ROUTINE W REFLEX MICROSCOPIC
BILIRUBIN URINE: NEGATIVE
Glucose, UA: 100 mg/dL — AB
Hgb urine dipstick: NEGATIVE
KETONES UR: NEGATIVE mg/dL
NITRITE: NEGATIVE
Protein, ur: 100 mg/dL — AB
Specific Gravity, Urine: 1.022 (ref 1.005–1.030)
UROBILINOGEN UA: 1 mg/dL (ref 0.0–1.0)
pH: 8 (ref 5.0–8.0)

## 2014-01-19 LAB — URINE MICROSCOPIC-ADD ON

## 2014-01-19 LAB — PREGNANCY, URINE: Preg Test, Ur: NEGATIVE

## 2014-01-19 NOTE — ED Notes (Signed)
The pt  Has  Multiple complaints.  She does not feel well she feels cold she has neuropathy in her feet headache anxious . Some times her blood runs low.  lmp 2 months ago

## 2014-01-19 NOTE — ED Notes (Signed)
No answer in the waiting room

## 2014-01-20 MED ORDER — LORAZEPAM 1 MG PO TABS: 0.5000 mg | ORAL_TABLET | Freq: Two times a day (BID) | ORAL | Status: AC | PRN

## 2014-01-20 NOTE — ED Provider Notes (Signed)
CSN: [id number]     Arrival date & time 01/19/14  2121 History   First MD Initiated Contact with Patient 01/20/14 0002     Chief Complaint  Patient presents with  . 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  She is concerned that she may be anemic and has not had a period in 2 months at the age of 53.  The history is provided by the patient.    Past Medical History  Diagnosis Date  . DM type 1 causing eye disease   . Acid reflux   . High cholesterol   . Psoriasis   . Menorrhagia   . Anemia   . Hyperglycemia   . Allergic rhinitis   . Diabetic retinopathy associated with type 1 diabetes mellitus   . GERD (gastroesophageal reflux disease)   . Cachexia    History reviewed. No pertinent past surgical history. No family history on file. History  Substance Use Topics  . Smoking status: Never Smoker   . Smokeless tobacco: Not on file  . Alcohol Use: No   OB History   Grav Para Term Preterm Abortions TAB SAB Ect Mult Living                 Review of Systems  Unable to perform ROS Constitutional: Positive for chills. Negative for fever.  HENT: Negative for rhinorrhea.   Eyes: Negative for visual disturbance.  Respiratory: Negative for cough and shortness of breath.   Cardiovascular: Negative for chest pain and leg swelling.  Gastrointestinal: Negative for nausea, vomiting, abdominal pain, diarrhea and constipation.  Endocrine: Positive for cold intolerance. Negative for polydipsia, polyphagia and polyuria.  Genitourinary: Negative for dysuria and frequency.  Musculoskeletal: Negative for back pain, joint swelling and neck pain.  Skin: Negative for rash and wound.  Neurological: Positive for numbness and headaches. Negative for dizziness.   Psychiatric/Behavioral: The patient is nervous/anxious.   All other systems reviewed and are negative.     Allergies  Review of patient's allergies indicates no known allergies.  Home Medications   Prior to Admission medications   Medication Sig Start Date End Date Taking? Authorizing Provider  ferrous sulfate 325 (65 FE) MG tablet Take 1 tablet (325 mg total) by mouth 2 (two) times daily with a meal. 03/10/13 03/10/14 Yes Ripudeep K Rai, MD  FLUoxetine (PROZAC) 10 MG capsule Take 3 capsule daily 06/08/13  Yes Cleotis NipperSyed T Arfeen, MD  gabapentin (NEURONTIN) 100 MG capsule Take 1 capsule (100 mg total) by mouth at bedtime. 11/08/13  Yes Deepak Advani, MD  insulin aspart (NOVOLOG) 100 UNIT/ML injection Inject 0-15 Units into the skin 3 (three) times daily with meals. CBG 121 - 150: 2 units,151 - 200: 3 units, 201 - 250: 5 units, 251 - 300: 8 units, 301 - 350: 11 units, 351 - 400: 15 units 06/08/13 06/08/14 Yes Nishant Dhungel, MD  insulin glargine (LANTUS) 100 UNIT/ML injection Inject 0.12 mLs (12 Units total) into the skin daily. 03/10/13 03/10/14 Yes Ripudeep Jenna LuoK Rai, MD  lisinopril (PRINIVIL,ZESTRIL) 10 MG tablet Take 1 tablet (10 mg total) by mouth daily. 11/08/13  Yes Deepak Advani, MD  pantoprazole (PROTONIX) 40 MG tablet Take 1 tablet (40 mg total) by mouth daily. 03/10/13  Yes Ripudeep Jenna LuoK Rai, MD  rosuvastatin (CRESTOR) 10 MG tablet Take 1 tablet (10 mg total) by  mouth daily. 03/15/13  Yes [place] Cyndie Mull, MD  LORazepam (ATIVAN) 1 MG tablet Take 0.5 tablets (0.5 mg total) by mouth 2 (two) times daily as needed for anxiety. 01/20/14   Arman Filter, NP   BP 169/93  Pulse 83  Temp(Src) 98 F (36.7 C) (Oral)  Resp 18  SpO2 97%  LMP 11/19/2013 Physical Exam  Nursing note and vitals reviewed. Constitutional: She is oriented to person, place, and time. She appears well-developed and well-nourished. No distress.  HENT:  Right Ear: External ear normal.  Left Ear: External ear normal.   Mouth/Throat: Oropharynx is clear and moist.  Eyes: Pupils are equal, round, and reactive to light.  Neck: Normal range of motion. No spinous process tenderness and no muscular tenderness present. Normal range of motion present.  Cardiovascular: Normal rate and regular rhythm.   Murmur heard. Pulmonary/Chest: Effort normal and breath sounds normal.  Abdominal: Soft. She exhibits no distension. There is no tenderness.  Musculoskeletal: Normal range of motion. She exhibits no edema and no tenderness.  Lymphadenopathy:    She has no cervical adenopathy.  Neurological: She is alert and oriented to person, place, and time. A sensory deficit is present. No cranial nerve deficit. She displays a negative Romberg sign.  Neuropathy in feet with decreased sensation full ROM, color, and pulses normal bilaterally   Skin: Skin is warm. No rash noted. No erythema.    ED Course  Procedures (including critical care time) Labs Review Labs Reviewed  URINALYSIS, ROUTINE W REFLEX MICROSCOPIC - Abnormal; Notable for the following:    APPearance CLOUDY (*)    Glucose, UA 100 (*)    Protein, ur 100 (*)    Leukocytes, UA SMALL (*)    All other components within normal limits  CBC WITH DIFFERENTIAL - Abnormal; Notable for the following:    MCH 25.9 (*)    Eosinophils Relative 10 (*)    All other components within normal limits  COMPREHENSIVE METABOLIC PANEL - Abnormal; Notable for the following:    Sodium 135 (*)    Glucose, Bld 228 (*)    GFR calc non Af Amer 66 (*)    GFR calc Af Amer 77 (*)    All other components within normal limits  URINE MICROSCOPIC-ADD ON - Abnormal; Notable for the following:    Squamous Epithelial / LPF FEW (*)    Bacteria, UA FEW (*)    All other components within normal limits  PREGNANCY, URINE    Imaging Review No results found.   EKG Interpretation None      MDM  Patients labs and urine reviewed, discussed at length anxiety and her chronic diseases  Feels  comfortable going home with Rx for Antivan .5mg  BID PRN and offcie follow up   Also agrees to purchase a cane to assist in her balance issues  Final diagnoses:  Anxiety        Arman Filter, NP 01/20/14 0028

## 2014-01-20 NOTE — Discharge Instructions (Signed)
Panic Attacks Panic attacks are sudden, short feelings of great fear or discomfort. You may have them for no reason when you are relaxed, when you are uneasy (anxious), or when you are sleeping.  HOME CARE  Take all your medicines as told.  Check with your doctor before starting new medicines.  Keep all doctor visits. GET HELP IF:  You are not able to take your medicines as told.  Your symptoms do not get better.  Your symptoms get worse. GET HELP RIGHT AWAY IF:  Your attacks seem different than your normal attacks.  You have thoughts about hurting yourself or others.  You take panic attack medicine and you have a side effect. MAKE SURE YOU:  Understand these instructions.  Will watch your condition.  Will get help right away if you are not doing well or get worse. Document Released: 09/07/2010 Document Revised: 05/26/2013 Document Reviewed: 03/19/2013 [facility] Patient Information 2014 [place], [place]. Please make an appointment with your PCP for follow up You can safety take one of the Ativan tablets for your feeling of anxiety  I would suggest a cane to assist in your balance issues

## 2014-01-23 NOTE — ED Provider Notes (Signed)
Medical screening examination/treatment/procedure(s) were performed by non-physician practitioner and as supervising physician I was immediately available for consultation/collaboration.    Jazman Reuter D Fountain Derusha, MD 01/23/14 1103 

## 2016-12-17 DEATH — deceased
